# Patient Record
Sex: Male | Born: 1965 | Race: White | Hispanic: No | Marital: Single | Smoking: Current every day smoker
Health system: Southern US, Community
[De-identification: ages and names within clinical notes are randomized; demographics above are authoritative.]

## PROBLEM LIST (undated history)

## (undated) DIAGNOSIS — E7800 Pure hypercholesterolemia, unspecified: Secondary | ICD-10-CM

## (undated) DIAGNOSIS — T8859XA Other complications of anesthesia, initial encounter: Secondary | ICD-10-CM

## (undated) DIAGNOSIS — I10 Essential (primary) hypertension: Secondary | ICD-10-CM

## (undated) DIAGNOSIS — M1A9XX1 Chronic gout, unspecified, with tophus (tophi): Secondary | ICD-10-CM

## (undated) HISTORY — DX: Chronic gout, unspecified, with tophus (tophi): M1A.9XX1

## (undated) HISTORY — PX: NECK SURGERY: SHX720

---
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
## 2011-09-09 ENCOUNTER — Emergency Department: Payer: Self-pay | Admitting: *Deleted

## 2011-09-09 LAB — CBC
HCT: 44.3 % (ref 40.0–52.0)
HGB: 14.9 g/dL (ref 13.0–18.0)
MCH: 29.9 pg (ref 26.0–34.0)
MCHC: 33.7 g/dL (ref 32.0–36.0)
Platelet: 224 10*3/uL (ref 150–440)
RBC: 4.99 10*6/uL (ref 4.40–5.90)
RDW: 13.7 % (ref 11.5–14.5)
WBC: 10 10*3/uL (ref 3.8–10.6)

## 2011-09-09 LAB — COMPREHENSIVE METABOLIC PANEL
Albumin: 3.5 g/dL (ref 3.4–5.0)
Alkaline Phosphatase: 99 U/L (ref 50–136)
BUN: 13 mg/dL (ref 7–18)
Bilirubin,Total: 0.3 mg/dL (ref 0.2–1.0)
Calcium, Total: 8.6 mg/dL (ref 8.5–10.1)
Co2: 27 mmol/L (ref 21–32)
Creatinine: 0.97 mg/dL (ref 0.60–1.30)
EGFR (Non-African Amer.): >60
Osmolality: 284 (ref 275–301)
Potassium: 3.8 mmol/L (ref 3.5–5.1)
Total Protein: 7.1 g/dL (ref 6.4–8.2)

## 2011-09-09 LAB — LIPASE, BLOOD: Lipase: 121 U/L (ref 73–393)

## 2014-10-11 ENCOUNTER — Encounter: Payer: Self-pay | Admitting: Emergency Medicine

## 2014-10-11 ENCOUNTER — Emergency Department: Payer: Self-pay

## 2014-10-11 ENCOUNTER — Emergency Department
Admission: EM | Admit: 2014-10-11 | Discharge: 2014-10-11 | Disposition: A | Discharge status: Home / Self Care | Payer: Self-pay | Attending: Emergency Medicine | Admitting: Emergency Medicine

## 2014-10-11 DIAGNOSIS — Z720 Tobacco use: Secondary | ICD-10-CM | POA: Insufficient documentation

## 2014-10-11 DIAGNOSIS — Z79899 Other long term (current) drug therapy: Secondary | ICD-10-CM | POA: Insufficient documentation

## 2014-10-11 DIAGNOSIS — I10 Essential (primary) hypertension: Secondary | ICD-10-CM | POA: Insufficient documentation

## 2014-10-11 DIAGNOSIS — L03115 Cellulitis of right lower limb: Secondary | ICD-10-CM | POA: Insufficient documentation

## 2014-10-11 DIAGNOSIS — K625 Hemorrhage of anus and rectum: Secondary | ICD-10-CM | POA: Insufficient documentation

## 2014-10-11 HISTORY — DX: Essential (primary) hypertension: I10

## 2014-10-11 LAB — COMPREHENSIVE METABOLIC PANEL
ALBUMIN: 3.8 g/dL (ref 3.5–5.0)
ALK PHOS: 95 U/L (ref 38–126)
ALT: 57 U/L (ref 17–63)
ANION GAP: 11 (ref 5–15)
AST: 53 U/L — AB (ref 15–41)
BUN: 21 mg/dL — AB (ref 6–20)
CO2: 25 mmol/L (ref 22–32)
Calcium: 9.1 mg/dL (ref 8.9–10.3)
Chloride: 104 mmol/L (ref 101–111)
Creatinine, Ser: 1.29 mg/dL — ABNORMAL HIGH (ref 0.61–1.24)
GFR calc Af Amer: >60 mL/min (ref >60)
GFR calc non Af Amer: >60 mL/min (ref >60)
GLUCOSE: 200 mg/dL — AB (ref 65–99)
POTASSIUM: 3.8 mmol/L (ref 3.5–5.1)
SODIUM: 140 mmol/L (ref 135–145)
Total Bilirubin: 0.3 mg/dL (ref 0.3–1.2)
Total Protein: 7.3 g/dL (ref 6.5–8.1)

## 2014-10-11 LAB — ABO/RH: ABO/RH(D): A NEG

## 2014-10-11 LAB — CBC
HEMATOCRIT: 44.3 % (ref 40.0–52.0)
HEMOGLOBIN: 15 g/dL (ref 13.0–18.0)
MCH: 29.5 pg (ref 26.0–34.0)
MCHC: 34 g/dL (ref 32.0–36.0)
MCV: 86.8 fL (ref 80.0–100.0)
Platelets: 243 10*3/uL (ref 150–440)
RBC: 5.1 MIL/uL (ref 4.40–5.90)
RDW: 13.9 % (ref 11.5–14.5)
WBC: 11.1 10*3/uL — ABNORMAL HIGH (ref 3.8–10.6)

## 2014-10-11 LAB — TYPE AND SCREEN
ABO/RH(D): A NEG
Antibody Screen: NEGATIVE

## 2014-10-11 MED ORDER — CEFAZOLIN SODIUM 1-5 GM-% IV SOLN
1.0000 g | Freq: Once | INTRAVENOUS | Status: AC
  Administered 2014-10-11: 1 g via INTRAVENOUS
  Filled 2014-10-11: qty 50

## 2014-10-11 MED ORDER — CEPHALEXIN 500 MG PO CAPS: 500.0000 mg | ORAL_CAPSULE | Freq: Two times a day (BID) | ORAL | Status: DC

## 2014-10-11 NOTE — ED Provider Notes (Signed)
[facility] Emergency Department Provider Note  ____________________________________________  Time seen: 1:30 PM  I have reviewed the triage vital signs and the nursing notes.   HISTORY  Chief Complaint Wound Infection and Rectal Bleeding    HPI Howard Sanders is a 49 y.o. male who presents with complaints of right leg infection. He reports that he had a pimple in his leg that he popped about 5 days ago and he developed redness in the lower extremity. He went to urgent care and was started on antibiotics but has been taking them incorrectly. He is taking Septra once a day as opposed to twice a day. He reports he has burning in his right leg but the pain is mild to moderate. He does not complain of abnormal stools today. He denies fevers chills. 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     Past Medical History  Diagnosis Date  . Hypertension     There are no active problems to display for this patient.   No past surgical history on file.  Current Outpatient Rx  Name  Route  Sig  Dispense  Refill  . lisinopril-hydrochlorothiazide (PRINZIDE,ZESTORETIC) 10-12.5 MG per tablet   Oral   Take 1 tablet by mouth daily.         . cephALEXin (KEFLEX) 500 MG capsule   Oral   Take 1 capsule (500 mg total) by mouth 2 (two) times daily.   14 capsule   0     Allergies Bee venom and Iodine  No family history on file.  Social History Social History  Substance Use Topics  . Smoking status: Current Every Day Smoker  . Smokeless tobacco: None  . Alcohol Use: No    Review of Systems  Constitutional: Negative for fever. Eyes: Negative for visual changes. ENT: Negative for sore throat Cardiovascular: Negative for chest pain. Respiratory: Negative for shortness of breath. Gastrointestinal: Negative for abdominal pain, vomiting and diarrhea. Genitourinary: Negative for dysuria. Musculoskeletal: Negative for back  pain. Skin: Right leg rash and swelling Neurological: Negative for headaches or focal weakness Psychiatric: No anxiety    ____________________________________________   PHYSICAL EXAM:  VITAL SIGNS: ED Triage Vitals  Enc Vitals Group     BP 10/11/14 1009 138/94 mmHg     Pulse Rate 10/11/14 1009 96     Resp 10/11/14 1009 18     Temp 10/11/14 1009 97.9 F (36.6 C)     Temp src --      SpO2 10/11/14 1009 96 %     Weight 10/11/14 1009 245 lb (111.131 kg)     Height 10/11/14 1009 6' (1.829 m)     Head Cir --      Peak Flow --      Pain Score 10/11/14 1009 4     Pain Loc --      Pain Edu? --      Excl. in GC? --      Constitutional: Alert and oriented. Well appearing and in no distress. Eyes: Conjunctivae are normal.  ENT   Head: Normocephalic and atraumatic.   Mouth/Throat: Mucous membranes are moist. Cardiovascular: Normal rate, regular rhythm. Normal and symmetric distal pulses are present in all extremities. No murmurs, rubs, or gallops. Respiratory: Normal respiratory effort without tachypnea nor retractions. Breath sounds are clear and equal bilaterally.  Gastrointestinal: Soft and non-tender in all quadrants. No distention. There is no CVA tenderness. Genitourinary: deferred Musculoskeletal: Nontender with  normal range of motion in all extremities. No lower extremity tenderness nor edema. Neurologic:  Normal speech and language. No gross focal neurologic deficits are appreciated. Skin:  Skin is warm, dry and intact. Patient with swelling to the right lower show any primarily around the calf and anterior tibial area, mild swelling to the area. Some streaking noted as well Psychiatric: Mood and affect are normal. Patient exhibits appropriate insight and judgment.  ____________________________________________    LABS (pertinent positives/negatives)  Labs Reviewed  COMPREHENSIVE METABOLIC PANEL - Abnormal; Notable for the following:    Glucose, Bld 200 (*)     BUN 21 (*)    Creatinine, Ser 1.29 (*)    AST 53 (*)    All other components within normal limits  CBC - Abnormal; Notable for the following:    WBC 11.1 (*)    All other components within normal limits  TYPE AND SCREEN  ABO/RH    ____________________________________________   EKG  None  ____________________________________________    RADIOLOGY I have personally reviewed any xrays that were ordered on this patient: Ultrasound shows no DVT  ____________________________________________   PROCEDURES  Procedure(s) performed: none  Critical Care performed: none  ____________________________________________   INITIAL IMPRESSION / ASSESSMENT AND PLAN / ED COURSE  Pertinent labs & imaging results that were available during my care of the patient were reviewed by me and considered in my medical decision making (see chart for details).  Patient overall well-appearing, afebrile, normal heart rate. His white count is minimally elevated. I will give him 1 g of Ancef in the emergency department and check an ultrasound of his leg to rule out DVT given his occupation.   Ultrasound was negative for DVT.  I did educate him on the proper dosing of Bactrim and I will also add on Keflex. I discussed return precautions with the patient, he knows to return if any worsening of discomfort swelling or development of fevers chills nausea or vomiting  ____________________________________________   FINAL CLINICAL IMPRESSION(S) / ED DIAGNOSES  Final diagnoses:  Cellulitis of right lower extremity     Jene Every, MD 10/11/14 1526

## 2014-10-11 NOTE — ED Notes (Signed)
Pt to ultrasound

## 2014-10-11 NOTE — Discharge Instructions (Signed)

## 2014-10-11 NOTE — ED Notes (Addendum)
Pt reports that he had "pimple" in his groin area that popped during his sleep on Wednesday night. By Thursday morning, he began to see redness and have pain running down the right leg. His thigh is red, and his lower right leg is very red and hot. Pt went to urgent care with fever and redness on Friday and was given bactrim. He took it but thinks it has caused him to have bloody stool. He reports that his BM this morning was very thick and black. Pt is alert & oriented with warm, dry skin.

## 2014-10-11 NOTE — ED Notes (Signed)
Says being treated with septra for cellulitis right lower leg. Leg no better, and he is now having blood in stools (black stools.)

## 2014-10-11 NOTE — ED Notes (Signed)
Pt discharged home after verbalizing understanding of discharge instructions; nad noted. 

## 2014-10-29 ENCOUNTER — Encounter: Payer: Self-pay | Admitting: Family Medicine

## 2014-11-03 DIAGNOSIS — R609 Edema, unspecified: Secondary | ICD-10-CM

## 2014-11-03 DIAGNOSIS — R739 Hyperglycemia, unspecified: Secondary | ICD-10-CM

## 2014-11-03 DIAGNOSIS — I10 Essential (primary) hypertension: Secondary | ICD-10-CM | POA: Insufficient documentation

## 2014-11-03 DIAGNOSIS — E781 Pure hyperglyceridemia: Secondary | ICD-10-CM | POA: Insufficient documentation

## 2014-11-03 DIAGNOSIS — E119 Type 2 diabetes mellitus without complications: Secondary | ICD-10-CM | POA: Insufficient documentation

## 2014-11-08 ENCOUNTER — Encounter: Payer: Self-pay | Admitting: Family Medicine

## 2014-11-10 ENCOUNTER — Ambulatory Visit (INDEPENDENT_AMBULATORY_CARE_PROVIDER_SITE_OTHER): Payer: Self-pay | Admitting: Family Medicine

## 2014-11-10 ENCOUNTER — Encounter: Payer: Self-pay | Admitting: Family Medicine

## 2014-11-10 VITALS — BP 140/80 | HR 98 | Temp 97.7°F | Resp 16 | Ht 72.0 in | Wt 246.0 lb

## 2014-11-10 DIAGNOSIS — R739 Hyperglycemia, unspecified: Secondary | ICD-10-CM

## 2014-11-10 DIAGNOSIS — Z720 Tobacco use: Secondary | ICD-10-CM

## 2014-11-10 DIAGNOSIS — E781 Pure hyperglyceridemia: Secondary | ICD-10-CM

## 2014-11-10 DIAGNOSIS — R609 Edema, unspecified: Secondary | ICD-10-CM

## 2014-11-10 DIAGNOSIS — I10 Essential (primary) hypertension: Secondary | ICD-10-CM

## 2014-11-10 MED ORDER — FUROSEMIDE 20 MG PO TABS: 20.0000 mg | ORAL_TABLET | Freq: Every day | ORAL | Status: DC

## 2014-11-10 MED ORDER — LISINOPRIL-HYDROCHLOROTHIAZIDE 10-12.5 MG PO TABS: 1.0000 | ORAL_TABLET | Freq: Every day | ORAL | Status: DC

## 2014-11-10 NOTE — Patient Instructions (Signed)
Please stop smoking 

## 2014-11-10 NOTE — Progress Notes (Signed)
Patient: Howard Sanders Male    DOB: 1965-07-13   49 y.o.   MRN: [id number] Visit Date: 11/10/2014  Today's Provider: Mila Merry, MD   Chief Complaint  Patient presents with  . Follow-up  . Medication Refill  . Hypertension   Subjective:    HPI   Hypertriglycerideamia Last lipid 09/02/2012 found TC=242, TAG=928 and very low HDL=19.  He has not returned since then for follow up due to lack of health insurance. 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  He was seen in ER 10/11/14 for cellulitis of right leg which has now resolved.  His blood sugar is noted to have 200 at the time.  He had normal renal functions at that time.     Hypertension, follow-up:  BP Readings from Last 3 Encounters:  11/10/14 140/80  10/11/14 138/94    He was last seen for hypertension 09/26/2012. BP at that visit was 118/84. Management since that visit includes none .He reports good compliance with treatment. He is not having side effects. none  He is exercising. He is not adherent to low salt diet.   Outside blood pressures are 117/69. He is experiencing none.  Patient denies none.   Cardiovascular risk factors include none.  Use of agents associated with hypertension: none.   BMP Latest Ref Rng 10/11/2014 09/09/2011  Glucose 65 - 99 mg/dL 409(W) 119(J)  BUN 6 - 20 mg/dL 47(W) 13  Creatinine 2.95 - 1.24 mg/dL 6.21(H) 0.86  Sodium 578 - 145 mmol/L 140 142  Potassium 3.5 - 5.1 mmol/L 3.8 3.8  Chloride 101 - 111 mmol/L 104 107  CO2 22 - 32 mmol/L 25 27  Calcium 8.9 - 10.3 mg/dL 9.1 8.6     ---------------------------------------------------------------------       Allergies  Allergen Reactions  . Bee Venom Anaphylaxis  . Iodine Anaphylaxis   Previous Medications   CEPHALEXIN (KEFLEX) 500 MG CAPSULE    Take 1 capsule (500 mg total) by mouth 2 (two) times daily.   LISINOPRIL-HYDROCHLOROTHIAZIDE (PRINZIDE,ZESTORETIC)  10-12.5 MG PER TABLET    Take 1 tablet by mouth daily.   OMEGA-3 FATTY ACIDS (FISH OIL PO)    Take by mouth.    Review of Systems  Cardiovascular: Positive for leg swelling. Negative for chest pain and palpitations.  Neurological: Positive for light-headedness. Negative for headaches.    Social History  Substance Use Topics  . Smoking status: Current Every Day Smoker  . Smokeless tobacco: Not on file  . Alcohol Use: No   Objective:   BP 140/80 mmHg  Pulse 98  Temp(Src) 97.7 F (36.5 C) (Oral)  Resp 16  Ht 6' (1.829 m)  Wt 246 lb (111.585 kg)  BMI 33.36 kg/m2  SpO2 97%  Physical Exam  General Appearance:    Alert, cooperative, no distress, obese  Eyes:    PERRL, conjunctiva/corneas clear, EOM's intact       Lungs:     Clear to auscultation bilaterally, respirations unlabored  Heart:    Regular rate and rhythm, 1+ bilateral LE edema. No erythema or tenderness in either leg.   Neurologic:   Awake, alert, oriented x 3. No apparent focal neurological           defect.           Assessment & Plan:     1. Hyperglycemia 200 at recent ER visit (non-fasting) - Hemoglobin A1c  2. Essential hypertension well controlled Continue current medications.   - lisinopril-hydrochlorothiazide (PRINZIDE,ZESTORETIC) 10-12.5 MG per tablet; Take 1 tablet by mouth daily.  Dispense: 30 tablet; Refill: 3  3. Hypertriglyceridemia  - Lipid panel  4. Tobacco abuse Stop smotking  5. Edema  - He would like something to take prn as his legs swelling quite a bit when he is driving. He did have normal TSH when evaluated for this in 2014 .Given rx for furosemide .        Mila Merry, MD  [facility]

## 2014-11-11 ENCOUNTER — Telehealth: Payer: Self-pay | Admitting: Family Medicine

## 2014-11-11 LAB — LIPID PANEL
CHOLESTEROL TOTAL: 231 mg/dL — AB (ref 100–199)
Chol/HDL Ratio: 13.6 ratio units — ABNORMAL HIGH (ref 0.0–5.0)
HDL: 17 mg/dL — ABNORMAL LOW (ref >39)
TRIGLYCERIDES: 729 mg/dL — AB (ref 0–149)

## 2014-11-11 LAB — HEMOGLOBIN A1C
Est. average glucose Bld gHb Est-mCnc: 148 mg/dL
HEMOGLOBIN A1C: 6.8 % — AB (ref 4.8–5.6)

## 2014-11-11 MED ORDER — METFORMIN HCL ER 500 MG PO TB24: 500.0000 mg | ORAL_TABLET | Freq: Every day | ORAL | Status: DC

## 2014-11-11 MED ORDER — LOVASTATIN 40 MG PO TABS: 40.0000 mg | ORAL_TABLET | Freq: Every day | ORAL | Status: DC

## 2014-11-11 NOTE — Telephone Encounter (Signed)
Pt called to get lab results. Thanks TNP ° °

## 2014-11-11 NOTE — Telephone Encounter (Signed)
Patient was notified of results. Expressed understanding. Rx's sent to pharmacy.  

## 2014-11-11 NOTE — Telephone Encounter (Signed)
-----   Message from Malva Limes, MD sent at 11/11/2014  7:56 AM EDT ----- Labs show his blood sugar is averaging 148 over the last 3 months, which is in diabetic range. Cholesterol is high at 231 and triglycerides are very high at 729. He has very high risk of developing heart disease. Need to start lovastatin  one tablet every evening for cholesterol, #30, rf x 2 Need to start metformin ER , one tablet every evening for blood sugar, #30, rf x 2 Need to take 81 mg coated aspirin once a day. Avoid sweets and starchy foods like bread, white pasta, and white potatoes.  Follow up on meds and BP check in 6 weeks.

## 2014-12-20 ENCOUNTER — Encounter: Payer: Self-pay | Admitting: Family Medicine

## 2014-12-20 ENCOUNTER — Ambulatory Visit (INDEPENDENT_AMBULATORY_CARE_PROVIDER_SITE_OTHER): Payer: Self-pay | Admitting: Family Medicine

## 2014-12-20 VITALS — BP 104/84 | HR 81 | Temp 98.0°F | Resp 16 | Ht 72.0 in | Wt 236.0 lb

## 2014-12-20 DIAGNOSIS — E781 Pure hyperglyceridemia: Secondary | ICD-10-CM

## 2014-12-20 DIAGNOSIS — R601 Generalized edema: Secondary | ICD-10-CM

## 2014-12-20 DIAGNOSIS — E119 Type 2 diabetes mellitus without complications: Secondary | ICD-10-CM

## 2014-12-20 DIAGNOSIS — I10 Essential (primary) hypertension: Secondary | ICD-10-CM

## 2014-12-20 DIAGNOSIS — Z0000 Encounter for general adult medical examination without abnormal findings: Secondary | ICD-10-CM

## 2014-12-20 MED ORDER — FUROSEMIDE 20 MG PO TABS: 20.0000 mg | ORAL_TABLET | Freq: Every day | ORAL | Status: DC

## 2014-12-20 NOTE — Patient Instructions (Signed)
   Recommend taking 81mg enteric coated aspirin to reduce risk of vascular events such as heart attacks and strokes.    

## 2014-12-20 NOTE — Progress Notes (Signed)
Patient: Howard Sanders Male    DOB: 06/23/1965   49 y.o.   MRN: [id number] Visit Date: 12/20/2014  Today's Provider: Mila Merryonald Fisher, MD   Chief Complaint  Patient presents with  . Follow-up  . Hypertension   Subjective:    HPI   Lipid/Cholesterol, Follow-up:    Management changes since that visit include starting lovastatin. . Last Lipid Panel:    Component Value Date/Time   CHOL 231* 11/10/2014 0859   TRIG 729* 11/10/2014 0859   HDL 17* 11/10/2014 0859   CHOLHDL 13.6* 11/10/2014 0859   LDLCALC Comment 11/10/2014 0859     He reports good compliance with treatment. He is not having side effects.    Wt Readings from Last 3 Encounters:  12/20/14 236 lb (107.049 kg)  11/10/14 246 lb (111.585 kg)  10/11/14 245 lb (111.131 kg)    -------------------------------------------------------------------   Diabetes Mellitus Type II, Follow-up:   Lab Results  Component Value Date   HGBA1C 6.8* 11/10/2014    Last seen for diabetes 1 months ago.  Management since then includes starting metformin ER. He reports good compliance with treatment. He is having side effects. Frequent bowel movements   ------------------------------------------------------------------------      Hypertension, follow-up:  BP Readings from Last 3 Encounters:  12/20/14 104/84  11/10/14 140/80  10/11/14 138/94    He was last seen for hypertension 1 months ago.  BP at that visit was 140/80. Management since that visit includes continuing lisinopril-hctz 10-12.5  He reports good compliance with treatment. He is not having side effects. none  He yes exercising. He is not adherent to low salt diet.   Outside blood pressures are 116/75. He is experiencing none.  Patient denies none.   Cardiovascular risk factors include none.  Use of agents associated with hypertension: none.     Weight trend: decreasing steadily Wt Readings from Last 3 Encounters:  12/20/14 236 lb  (107.049 kg)  11/10/14 246 lb (111.585 kg)  10/11/14 245 lb (111.131 kg)    Current diet: well balanced  ----------------------------------------------------------------------  Edema: Is doing much better since starting furosemide which he is tolerating well.    Allergies  Allergen Reactions  . Bee Venom Anaphylaxis  . Iodine Anaphylaxis   Previous Medications   FUROSEMIDE (LASIX) 20 MG TABLET    Take 1 tablet (20 mg total) by mouth daily. As needed for swelling   LISINOPRIL-HYDROCHLOROTHIAZIDE (PRINZIDE,ZESTORETIC) 10-12.5 MG PER TABLET    Take 1 tablet by mouth daily.   LOVASTATIN (MEVACOR) 40 MG TABLET    Take 1 tablet (40 mg total) by mouth at bedtime.   METFORMIN (GLUCOPHAGE-XR) 500 MG 24 HR TABLET    Take 1 tablet (500 mg total) by mouth daily with breakfast.    Review of Systems  Cardiovascular: Negative for chest pain and palpitations.  Gastrointestinal: Positive for abdominal pain.       Upper abdomin     Social History  Substance Use Topics  . Smoking status: Current Every Day Smoker  . Smokeless tobacco: Not on file  . Alcohol Use: No   Objective:   BP 104/84 mmHg  Pulse 81  Temp(Src) 98 F (36.7 C) (Oral)  Resp 16  Ht 6' (1.829 m)  Wt 236 lb (107.049 kg)  BMI 32.00 kg/m2  SpO2 93%    Depression screen PHQ 2/9 12/20/2014  Decreased Interest 0  Down, Depressed, Hopeless 0  PHQ - 2 Score 0  Altered sleeping 0  Tired, decreased energy 0  Change in appetite 0  Feeling bad or failure about yourself  0  Trouble concentrating 0  Moving slowly or fidgety/restless 0  Suicidal thoughts 0  PHQ-9 Score 0  Difficult doing work/chores Not difficult at all      Physical Exam  General Appearance:    Alert, cooperative, no distress, obese  Eyes:    PERRL, conjunctiva/corneas clear, EOM's intact       Lungs:     Clear to auscultation bilaterally, respirations unlabored  Heart:    Regular rate and rhythm  Neurologic:   Awake, alert, oriented x 3. No  apparent focal neurological           defect.          Assessment & Plan:     1. Generalized edema Doing well with the addition of furosemide  2. Hypertriglyceridemia He is tolerating lovastatin well with no adverse effects.   - Lipid panel  3. Essential hypertension Well controlled. Continue current medications.    4. Type 2 diabetes mellitus without complication, without long-term current use of insulin (HCC) Continue metformin ER. Recheck a1c in 2 Months.        Mila Merry, MD  [facility]

## 2014-12-21 LAB — LIPID PANEL
CHOL/HDL RATIO: 8.1 ratio — AB (ref 0.0–5.0)
CHOLESTEROL TOTAL: 161 mg/dL (ref 100–199)
HDL: 20 mg/dL — ABNORMAL LOW (ref >39)
Triglycerides: 470 mg/dL — ABNORMAL HIGH (ref 0–149)

## 2015-02-07 ENCOUNTER — Other Ambulatory Visit: Payer: Self-pay | Admitting: Family Medicine

## 2015-02-07 DIAGNOSIS — I10 Essential (primary) hypertension: Secondary | ICD-10-CM

## 2015-02-07 NOTE — Telephone Encounter (Signed)
Pt contacted office for refill request on the following medications: 1. furosemide (LASIX) 20 MG tablet  2. lisinopril-hydrochlorothiazide (PRINZIDE,ZESTORETIC) 10-12.5 MG per tablet 3. lovastatin (MEVACOR) 40 MG tablet 4. metFORMIN (GLUCOPHAGE-XR) 500 MG 24 hr tablet Pt [facility] [address] [facility] day supply. 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 Pt stated that 90 day supply would be easier for him to manage. Thanks TNP

## 2015-02-07 NOTE — Telephone Encounter (Signed)
Requesting 90 day supply.

## 2015-02-08 MED ORDER — LOVASTATIN 40 MG PO TABS: 40.0000 mg | ORAL_TABLET | Freq: Every day | ORAL | Status: DC

## 2015-02-08 MED ORDER — METFORMIN HCL ER 500 MG PO TB24: 500.0000 mg | ORAL_TABLET | Freq: Every day | ORAL | Status: DC

## 2015-02-08 MED ORDER — LISINOPRIL-HYDROCHLOROTHIAZIDE 10-12.5 MG PO TABS: 1.0000 | ORAL_TABLET | Freq: Every day | ORAL | Status: DC

## 2015-02-08 MED ORDER — FUROSEMIDE 20 MG PO TABS: 20.0000 mg | ORAL_TABLET | Freq: Every day | ORAL | Status: DC

## 2015-02-22 ENCOUNTER — Ambulatory Visit (INDEPENDENT_AMBULATORY_CARE_PROVIDER_SITE_OTHER): Payer: Self-pay | Admitting: Family Medicine

## 2015-02-22 ENCOUNTER — Encounter: Payer: Self-pay | Admitting: Family Medicine

## 2015-02-22 VITALS — BP 130/84 | HR 87 | Temp 98.7°F | Resp 16 | Ht 72.0 in | Wt 237.0 lb

## 2015-02-22 DIAGNOSIS — E119 Type 2 diabetes mellitus without complications: Secondary | ICD-10-CM

## 2015-02-22 LAB — POCT GLYCOSYLATED HEMOGLOBIN (HGB A1C)
Est. average glucose Bld gHb Est-mCnc: 128
Hemoglobin A1C: 6.1

## 2015-02-22 NOTE — Patient Instructions (Signed)
Check your FASTING blood sugars at least 3 times each week Call or return for early office visit if you see your FASTING blood sugars consistently above 150.

## 2015-02-22 NOTE — Progress Notes (Signed)
Patient: Howard Sanders Male    DOB: Jul 03, 1965   50 y.o.   MRN: [id number] Visit Date: 02/22/2015  Today's Provider: Mila Merry, MD   Chief Complaint  Patient presents with  . Follow-up  . Diabetes  . Hypertension  . Hyperlipidemia  . Edema   Subjective:    HPI  Follow-up for edema from 12/20/2014;   Diabetes Mellitus Type II, Follow-up:   Lab Results  Component Value Date   HGBA1C 6.8* 11/10/2014   Last seen for diabetes 2 months ago.  Management since then includes; no changes He reports good compliance with treatment. He is not having side effects. none Current symptoms include none and have been stable. Home blood sugar records: fasting range: n/a  Episodes of hypoglycemia? no   Current Insulin Regimen: n/a Most Recent Eye Exam: n/a Weight trend: stable Prior visit with dietician: no Current diet: well balanced  Has cut out soft drinks completely from diet.  Current exercise: walking  ----------------------------------------------------------------------   Hypertension, follow-up:  BP Readings from Last 3 Encounters:  02/22/15 130/84  12/20/14 104/84  11/10/14 140/80    He was last seen for hypertension 2 months ago.  BP at that visit was 104/84 Management since that visit includes;none. Hegood compliance with treatment. He is not having side effects. none  He is exercising. He is not adherent to low salt diet.   Outside blood pressures are 130/78. He is experiencing none.  Patient denies none.   Cardiovascular risk factors include none.  Use of agents associated with hypertension: none.   ----------------------------------------------------------------------    Lipid/Cholesterol, Follow-up:   Last seen for this 2 months ago.  Management since that visit includes; continuing lovastatin   Last Lipid Panel:  Lab Results  Component Value Date   CHOL 161 12/20/2014   CHOL 231* 11/10/2014   Lab Results  Component Value Date     HDL 20* 12/20/2014   HDL 17* 11/10/2014   Lab Results  Component Value Date   LDLCALC Comment 12/20/2014   LDLCALC Comment 11/10/2014   Lab Results  Component Value Date   TRIG 470* 12/20/2014   TRIG 729* 11/10/2014   Lab Results  Component Value Date   CHOLHDL 8.1* 12/20/2014   CHOLHDL 13.6* 11/10/2014   No results found for: LDLDIRECT   He reports good compliance with treatment. He is not having side effects. none  Wt Readings from Last 3 Encounters:  02/22/15 237 lb (107.502 kg)  12/20/14 236 lb (107.049 kg)  11/10/14 246 lb (111.585 kg)    ----------------------------------------------------------------------     Allergies  Allergen Reactions  . Bee Venom Anaphylaxis  . Iodine Anaphylaxis   Previous Medications   FUROSEMIDE (LASIX) 20 MG TABLET    Take 1 tablet (20 mg total) by mouth daily. As needed for swelling   LISINOPRIL-HYDROCHLOROTHIAZIDE (PRINZIDE,ZESTORETIC) 10-12.5 MG TABLET    Take 1 tablet by mouth daily.   LOVASTATIN (MEVACOR) 40 MG TABLET    Take 1 tablet (40 mg total) by mouth at bedtime.   METFORMIN (GLUCOPHAGE-XR) 500 MG 24 HR TABLET    Take 1 tablet (500 mg total) by mouth daily with breakfast.    Review of Systems  Constitutional: Negative for fever, chills and appetite change.  Respiratory: Negative for chest tightness, shortness of breath and wheezing.   Cardiovascular: Negative for chest pain and palpitations.  Gastrointestinal: Negative for nausea, vomiting and abdominal pain.    Social History  Substance Use Topics  .  Smoking status: Current Every Day Smoker  . Smokeless tobacco: Not on file  . Alcohol Use: No   Objective:   BP 130/84 mmHg  Pulse 87  Temp(Src) 98.7 F (37.1 C) (Oral)  Resp 16  Ht 6' (1.829 m)  Wt 237 lb (107.502 kg)  BMI 32.14 kg/m2  SpO2 97%  Physical Exam  General Appearance:    Alert, cooperative, no distress, obese  Eyes:    PERRL, conjunctiva/corneas clear, EOM's intact       Lungs:      Clear to auscultation bilaterally, respirations unlabored  Heart:    Regular rate and rhythm  Neurologic:   Awake, alert, oriented x 3. No apparent focal neurological           defect.       Results for orders placed or performed in visit on 02/22/15  POCT glycosylated hemoglobin (Hb A1C)  Result Value Ref Range   Hemoglobin A1C 6.1    Est. average glucose Bld gHb Est-mCnc 128        Assessment & Plan:     1. Type 2 diabetes mellitus without complication, without long-term current use of insulin (HCC) Doing much better with dietary changes and metformin Encouraged to get diabetic eye exam.  - POCT glycosylated hemoglobin (Hb A1C)    Follow up 6 months.       Mila Merryonald Delma Drone, MD  [facility]

## 2015-05-14 ENCOUNTER — Other Ambulatory Visit: Payer: Self-pay | Admitting: Family Medicine

## 2015-05-16 ENCOUNTER — Other Ambulatory Visit: Payer: Self-pay | Admitting: Family Medicine

## 2015-05-16 MED ORDER — LOVASTATIN 40 MG PO TABS: ORAL_TABLET | ORAL | Status: DC

## 2015-05-16 MED ORDER — METFORMIN HCL ER 500 MG PO TB24: ORAL_TABLET | ORAL | Status: DC

## 2015-05-16 NOTE — Telephone Encounter (Signed)
Pt needs refills on   metFORMIN (GLUCOPHAGE-XR) 500 MG 24 hr tablet lovastatin (MEVACOR) 40 MG tablet  [facility]  Thanks Barth Kirkseri

## 2015-08-22 ENCOUNTER — Ambulatory Visit: Payer: Self-pay | Admitting: Family Medicine

## 2015-08-24 ENCOUNTER — Other Ambulatory Visit: Payer: Self-pay | Admitting: Family Medicine

## 2015-08-24 DIAGNOSIS — I10 Essential (primary) hypertension: Secondary | ICD-10-CM

## 2015-08-24 MED ORDER — LISINOPRIL-HYDROCHLOROTHIAZIDE 10-12.5 MG PO TABS: 1.0000 | ORAL_TABLET | Freq: Every day | ORAL | Status: DC

## 2015-08-24 MED ORDER — FUROSEMIDE 20 MG PO TABS: 20.0000 mg | ORAL_TABLET | Freq: Every day | ORAL | Status: DC

## 2015-08-24 NOTE — Telephone Encounter (Signed)
Pt contacted office for refill request on the following medications:  [facility] [address] [place]  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

## 2015-10-18 ENCOUNTER — Ambulatory Visit: Payer: Self-pay | Admitting: Family Medicine

## 2015-10-21 ENCOUNTER — Ambulatory Visit: Payer: Self-pay | Admitting: Family Medicine

## 2015-10-25 ENCOUNTER — Ambulatory Visit (INDEPENDENT_AMBULATORY_CARE_PROVIDER_SITE_OTHER): Payer: Self-pay | Admitting: Family Medicine

## 2015-10-25 ENCOUNTER — Encounter: Payer: Self-pay | Admitting: Family Medicine

## 2015-10-25 VITALS — BP 102/64 | HR 89 | Temp 98.0°F | Resp 16 | Ht 72.0 in | Wt 234.0 lb

## 2015-10-25 DIAGNOSIS — I10 Essential (primary) hypertension: Secondary | ICD-10-CM

## 2015-10-25 DIAGNOSIS — E119 Type 2 diabetes mellitus without complications: Secondary | ICD-10-CM

## 2015-10-25 DIAGNOSIS — Z720 Tobacco use: Secondary | ICD-10-CM

## 2015-10-25 LAB — POCT GLYCOSYLATED HEMOGLOBIN (HGB A1C)
ESTIMATED AVERAGE GLUCOSE: 143
Hemoglobin A1C: 6.6

## 2015-10-25 MED ORDER — HYDROCHLOROTHIAZIDE 12.5 MG PO TABS: 12.5000 mg | ORAL_TABLET | Freq: Every day | ORAL | 3 refills | Status: DC

## 2015-10-25 MED ORDER — BUPROPION HCL ER (SR) 150 MG PO TB12: ORAL_TABLET | ORAL | 6 refills | Status: DC

## 2015-10-25 NOTE — Progress Notes (Signed)
Patient: Howard Sanders Male    DOB: 02/15/1966   50 y.o.   MRN: [id number] Visit Date: 10/25/2015  Today's Provider: Mila Merryonald Rynlee [place], MD   Chief Complaint  Patient presents with  . Follow-up  . Diabetes  . Hypertension  . Hyperlipidemia   Subjective:    HPI   Diabetes Mellitus Type II, Follow-up:   Lab Results  Component Value Date   HGBA1C 6.6 10/25/2015   HGBA1C 6.1 02/22/2015   HGBA1C 6.8 (H) 11/10/2014   Last seen for diabetes 8 months ago.  Management since then includes; no changes. encouraged to get diabetic eye exam. He reports good compliance with treatment. He is not having side effects. none Current symptoms include none and have been unchanged. Home blood sugar records: fasting range: not checking  Episodes of hypoglycemia? no   Current Insulin Regimen: n/a Most Recent Eye Exam: none Weight trend: stable Prior visit with dietician: no Current diet: well balanced Current exercise: walking  ----------------------------------------------------------------   Hypertension, follow-up:  BP Readings from Last 3 Encounters:  10/25/15 102/64  02/22/15 130/84  12/20/14 104/84    He was last seen for hypertension 11 months ago.  BP at that visit was 104/84. Management since that visit includes; no changes.He reports good compliance with treatment. He is not having side effects. none He is exercising. He is adherent to low salt diet.   Outside blood pressures are 108/70. He is experiencing none.  Patient denies none.   Cardiovascular risk factors include diabetes mellitus.  Use of agents associated with hypertension: none.   ----------------------------------------------------------------    Lipid/Cholesterol, Follow-up:   Last seen for this 11 months ago.  Management since that visit includes; no changes.  Last Lipid Panel:    Component Value Date/Time   CHOL 161 12/20/2014 0920   TRIG 470 (H) 12/20/2014 0920   HDL 20 (L) 12/20/2014  0920   CHOLHDL 8.1 (H) 12/20/2014 0920   LDLCALC Comment 12/20/2014 0920    He reports good compliance with treatment. He is not having side effects. none  Wt Readings from Last 3 Encounters:  10/25/15 234 lb (106.1 kg)  02/22/15 237 lb (107.5 kg)  12/20/14 236 lb (107 kg)    ----------------------------------------------------------------    Allergies  Allergen Reactions  . Bee Venom Anaphylaxis  . Iodine Anaphylaxis     Current Outpatient Prescriptions:  .  furosemide (LASIX) 20 MG tablet, Take 1 tablet (20 mg total) by mouth daily. As needed for swelling, Disp: 90 tablet, Rfl: 0 .  lisinopril-hydrochlorothiazide (PRINZIDE,ZESTORETIC) 10-12.5 MG tablet, Take 1 tablet by mouth daily., Disp: 90 tablet, Rfl: 0 .  lovastatin (MEVACOR) 40 MG tablet, TAKE 1 TABLET (40 MG TOTAL) BY MOUTH AT BEDTIME., Disp: 90 tablet, Rfl: 4 .  metFORMIN (GLUCOPHAGE-XR) 500 MG 24 hr tablet, TAKE 1 TABLET (500 MG TOTAL) BY MOUTH DAILY WITH BREAKFAST., Disp: 90 tablet, Rfl: 4  Review of Systems  Constitutional: Negative for appetite change, chills and fever.  Respiratory: Negative for chest tightness, shortness of breath and wheezing.   Cardiovascular: Negative for chest pain and palpitations.  Gastrointestinal: Negative for abdominal pain, nausea and vomiting.  Neurological: Positive for dizziness and light-headedness.    Social History  Substance Use Topics  . Smoking status: Current Every Day Smoker  . Smokeless tobacco: Not on file  . Alcohol use No   Objective:   BP 102/64 (BP Location: Right Arm, Patient Position: Sitting, Cuff Size: Large)   Pulse 89  Temp 98 F (36.7 C) (Oral)   Resp 16   Ht 6' (1.829 m)   Wt 234 lb (106.1 kg)   SpO2 97%   BMI 31.74 kg/m   Physical Exam  General Appearance:    Alert, cooperative, no distress, obese  Eyes:    PERRL, conjunctiva/corneas clear, EOM's intact       Lungs:     Clear to auscultation bilaterally, respirations unlabored  Heart:     Regular rate and rhythm  Neurologic:   Awake, alert, oriented x 3. No apparent focal neurological           defect.       Results for orders placed or performed in visit on 10/25/15  POCT glycosylated hemoglobin (Hb A1C)  Result Value Ref Range   Hemoglobin A1C 6.6    Est. average glucose Bld gHb Est-mCnc 143        Assessment & Plan:     1. Type 2 diabetes mellitus without complication, without long-term current use of insulin (HCC) Well controlled.  Has been getting strict with diet and losing weight. Encouraged regular exercise. Continue current medications.   - POCT glycosylated hemoglobin (Hb A1C)  2. Essential hypertension Change to hctz monotherapy due to episodes of hypotension. He is having mild edema so will continue diuretic instead of ACEI.   3. Tobacco abuse Discusses health risks of smoking and benefits of smoking cessation. Given rx bupropion to try when he is ready to quit.   Return in about 4 months (around 02/24/2016) for Yearly Physical.        Mila Merry, MD  [facility]

## 2015-10-25 NOTE — Patient Instructions (Addendum)
Please contact your eyecare professional to schedule a routine eye exam    Smoking Hazards Smoking cigarettes is extremely bad for your health. Tobacco smoke has over 200 known poisons in it. It contains the poisonous gases nitrogen oxide and carbon monoxide. There are over 60 chemicals in tobacco smoke that cause cancer. Some of the chemicals found in cigarette smoke include:   Cyanide.   Benzene.   Formaldehyde.   Methanol (wood alcohol).   Acetylene (fuel used in welding torches).   Ammonia.  Even smoking lightly shortens your life expectancy by several years. You can greatly reduce the risk of medical problems for you and your family by stopping now. Smoking is the most preventable cause of death and disease in our society. Within days of quitting smoking, your circulation improves, you decrease the risk of having a heart attack, and your lung capacity improves. There may be some increased phlegm in the first few days after quitting, and it may take months for your lungs to clear up completely. Quitting for 10 years reduces your risk of developing lung cancer to almost that of a nonsmoker.  WHAT ARE THE RISKS OF SMOKING? Cigarette smokers have an increased risk of many serious medical problems, including:  Lung cancer.   Lung disease (such as pneumonia, bronchitis, and emphysema).   Heart attack and chest pain due to the heart not getting enough oxygen (angina).   Heart disease and peripheral blood vessel disease.   Hypertension.   Stroke.   Oral cancer (cancer of the lip, mouth, or voice box).   Bladder cancer.   Pancreatic cancer.   Cervical cancer.   Pregnancy complications, including premature birth.   Stillbirths and smaller newborn babies, birth defects, and genetic damage to sperm.   Early menopause.   Lower estrogen level for women.   Infertility.   Facial wrinkles.   Blindness.   Increased risk of broken bones (fractures).    Senile dementia.   Stomach ulcers and internal bleeding.   Delayed wound healing and increased risk of complications during surgery. Because of secondhand smoke exposure, children of smokers have an increased risk of the following:   Sudden infant death syndrome (SIDS).   Respiratory infections.   Lung cancer.   Heart disease.   Ear infections.  WHY IS SMOKING ADDICTIVE? Nicotine is the chemical agent in tobacco that is capable of causing addiction or dependence. When you smoke and inhale, nicotine is absorbed rapidly into the bloodstream through your lungs. Both inhaled and noninhaled nicotine may be addictive.  WHAT ARE THE BENEFITS OF QUITTING?  There are many health benefits to quitting smoking. Some are:   The likelihood of developing cancer and heart disease decreases. Health improvements are seen almost immediately.   Blood pressure, pulse rate, and breathing patterns start returning to normal soon after quitting.   People who quit may see an improvement in their overall quality of life.  HOW DO YOU QUIT SMOKING? Smoking is an addiction with both physical and psychological effects, and longtime habits can be hard to change. Your health care provider can recommend:  Programs and community resources, which may include group support, education, or therapy.  Replacement products, such as patches, gum, and nasal sprays. Use these products only as directed. Do not replace cigarette smoking with electronic cigarettes (commonly called e-cigarettes). The safety of e-cigarettes is unknown, and some may contain harmful chemicals. FOR MORE INFORMATION  American Lung Association: www.lung.org  American Cancer Society: www.cancer.org   This information  is not intended to replace advice given to you by your health care provider. Make sure you discuss any questions you have with your health care provider.   Document Released: 03/15/2004 Document Revised: 11/26/2012  Document Reviewed: 07/28/2012 Elsevier Interactive Patient Education 2016 [employer].          Bupropion sustained-release tablets (smoking cessation) What is this medicine? BUPROPION (byoo PROE pee on) is used to help people quit smoking. This medicine may be used for other purposes; ask your health care provider or pharmacist if you have questions. What should I tell my health care provider before I take this medicine? They need to know if you have any of these conditions: -an eating disorder, such as anorexia or bulimia -bipolar disorder or psychosis -diabetes or high blood sugar, treated with medication -glaucoma -head injury or brain tumor -heart disease, previous heart attack, or irregular heart beat -high blood pressure -kidney or liver disease -seizures -suicidal thoughts or a previous suicide attempt -Tourette's syndrome -weight loss -an unusual or allergic reaction to bupropion, other medicines, foods, dyes, or preservatives -breast-feeding -pregnant or trying to become pregnant How should I use this medicine? Take this medicine by mouth with a glass of water. Follow the directions on the prescription label. You can take it with or without food. If it upsets your stomach, take it with food. Do not cut, crush or chew this medicine. Take your medicine at regular intervals. If you take this medicine more than once a day, take your second dose at least 8 hours after you take your first dose. To limit difficulty in sleeping, avoid taking this medicine at bedtime. Do not take your medicine more often than directed. Do not stop taking this medicine suddenly except upon the advice of your doctor. Stopping this medicine too quickly may cause serious side effects. A special MedGuide will be given to you by the pharmacist with each prescription and refill. Be sure to read this information carefully each time. Talk to your pediatrician regarding the use of this medicine in  children. Special care may be needed. Overdosage: If you think you have taken too much of this medicine contact a poison control center or emergency room at once. NOTE: This medicine is only for you. Do not share this medicine with others. What if I miss a dose? If you miss a dose, skip the missed dose and take your next tablet at the regular time. There should be at least 8 hours between doses. Do not take double or extra doses. What may interact with this medicine? Do not take this medicine with any of the following medications: -linezolid -MAOIs like Azilect, Carbex, Eldepryl, Marplan, Nardil, and Parnate -methylene blue (injected into a vein) -other medicines that contain bupropion like Wellbutrin This medicine may also interact with the following medications: -alcohol -certain medicines for anxiety or sleep -certain medicines for blood pressure like metoprolol, propranolol -certain medicines for depression or psychotic disturbances -certain medicines for HIV or AIDS like efavirenz, lopinavir, nelfinavir, ritonavir -certain medicines for irregular heart beat like propafenone, flecainide -certain medicines for Parkinson's disease like amantadine, levodopa -certain medicines for seizures like carbamazepine, phenytoin, phenobarbital -cimetidine -clopidogrel -cyclophosphamide -furazolidone -isoniazid -nicotine -orphenadrine -procarbazine -steroid medicines like prednisone or cortisone -stimulant medicines for attention disorders, weight loss, or to stay awake -tamoxifen -theophylline -thiotepa -ticlopidine -tramadol -warfarin This list may not describe all possible interactions. Give your health care provider a list of all the medicines, herbs, non-prescription drugs, or dietary supplements you use. Also  tell them if you smoke, drink alcohol, or use illegal drugs. Some items may interact with your medicine. What should I watch for while using this medicine? Visit your doctor or  health care professional for regular checks on your progress. This medicine should be used together with a patient support program. It is important to participate in a behavioral program, counseling, or other support program that is recommended by your health care professional. Patients and their families should watch out for new or worsening thoughts of suicide or depression. Also watch out for sudden changes in feelings such as feeling anxious, agitated, panicky, irritable, hostile, aggressive, impulsive, severely restless, overly excited and hyperactive, or not being able to sleep. If this happens, especially at the beginning of treatment or after a change in dose, call your health care professional. Avoid alcoholic drinks while taking this medicine. Drinking excessive alcoholic beverages, using sleeping or anxiety medicines, or quickly stopping the use of these agents while taking this medicine may increase your risk for a seizure. Do not drive or use heavy machinery until you know how this medicine affects you. This medicine can impair your ability to perform these tasks. Do not take this medicine close to bedtime. It may prevent you from sleeping. Your mouth may get dry. Chewing sugarless gum or sucking hard candy, and drinking plenty of water may help. Contact your doctor if the problem does not go away or is severe. Do not use nicotine patches or chewing gum without the advice of your doctor or health care professional while taking this medicine. You may need to have your blood pressure taken regularly if your doctor recommends that you use both nicotine and this medicine together. What side effects may I notice from receiving this medicine? Side effects that you should report to your doctor or health care professional as soon as possible: -allergic reactions like skin rash, itching or hives, swelling of the face, lips, or tongue -breathing problems -changes in vision -confusion -fast or  irregular heartbeat -hallucinations -increased blood pressure -redness, blistering, peeling or loosening of the skin, including inside the mouth -seizures -suicidal thoughts or other mood changes -unusually weak or tired -vomiting Side effects that usually do not require medical attention (report to your doctor or health care professional if they continue or are bothersome): -change in sex drive or performance -constipation -headache -loss of appetite -nausea -tremors -weight loss This list may not describe all possible side effects. Call your doctor for medical advice about side effects. You may report side effects to FDA at 1-800-FDA-1088. Where should I keep my medicine? Keep out of the reach of children. Store at room temperature between 20 and 25 degrees C (68 and 77 degrees F). Protect from light. Keep container tightly closed. Throw away any unused medicine after the expiration date. NOTE: This sheet is a summary. It may not cover all possible information. If you have questions about this medicine, talk to your doctor, pharmacist, or health care provider.    2016, Elsevier/Gold Standard. (2012-10-03 10:55:10)

## 2015-11-19 ENCOUNTER — Other Ambulatory Visit: Payer: Self-pay | Admitting: Family Medicine

## 2015-11-19 DIAGNOSIS — I10 Essential (primary) hypertension: Secondary | ICD-10-CM

## 2015-11-26 ENCOUNTER — Other Ambulatory Visit: Payer: Self-pay | Admitting: Family Medicine

## 2016-02-27 ENCOUNTER — Encounter: Payer: Self-pay | Admitting: Family Medicine

## 2016-05-14 ENCOUNTER — Other Ambulatory Visit: Payer: Self-pay | Admitting: Family Medicine

## 2016-06-01 ENCOUNTER — Encounter: Payer: Self-pay | Admitting: Family Medicine

## 2016-07-30 ENCOUNTER — Encounter: Payer: Self-pay | Admitting: Family Medicine

## 2016-08-03 ENCOUNTER — Other Ambulatory Visit: Payer: Self-pay | Admitting: Family Medicine

## 2016-08-04 ENCOUNTER — Other Ambulatory Visit: Payer: Self-pay | Admitting: Family Medicine

## 2016-08-06 ENCOUNTER — Ambulatory Visit (INDEPENDENT_AMBULATORY_CARE_PROVIDER_SITE_OTHER): Payer: Self-pay | Admitting: Family Medicine

## 2016-08-06 ENCOUNTER — Encounter: Payer: Self-pay | Admitting: Family Medicine

## 2016-08-06 VITALS — BP 120/80 | HR 71 | Temp 97.7°F | Resp 16 | Ht 72.0 in | Wt 242.0 lb

## 2016-08-06 DIAGNOSIS — Z125 Encounter for screening for malignant neoplasm of prostate: Secondary | ICD-10-CM

## 2016-08-06 DIAGNOSIS — F17210 Nicotine dependence, cigarettes, uncomplicated: Secondary | ICD-10-CM | POA: Insufficient documentation

## 2016-08-06 DIAGNOSIS — Z1211 Encounter for screening for malignant neoplasm of colon: Secondary | ICD-10-CM

## 2016-08-06 DIAGNOSIS — E119 Type 2 diabetes mellitus without complications: Secondary | ICD-10-CM

## 2016-08-06 DIAGNOSIS — Z0000 Encounter for general adult medical examination without abnormal findings: Secondary | ICD-10-CM

## 2016-08-06 DIAGNOSIS — I10 Essential (primary) hypertension: Secondary | ICD-10-CM

## 2016-08-06 DIAGNOSIS — E781 Pure hyperglyceridemia: Secondary | ICD-10-CM

## 2016-08-06 NOTE — Progress Notes (Signed)
Patient: Howard Sanders, Male    DOB: 03-Sep-1965, 51 y.o.   MRN: [id number] Visit Date: 08/06/2016  Today's Provider: Mila Merry, MD   Chief Complaint  Patient presents with  . Annual Exam  . Diabetes  . Hypertension   Subjective:    Annual physical exam Howard Sanders is a 51 y.o. male who presents today for health maintenance and complete physical. He feels well. He reports exercising some/work. He reports he is sleeping well.  ----------------------------------------------------------------    Diabetes Mellitus Type II, Follow-up:   Lab Results  Component Value Date   HGBA1C 6.6 10/25/2015   HGBA1C 6.1 02/22/2015   HGBA1C 6.8 (H) 11/10/2014   Last seen for diabetes 9 months ago.  Management since then includes; no changes, encouraged to exercise regularly. He reports good compliance with treatment. He is not having side effects. none Current symptoms include none and have been unchanged. Home blood sugar records: fasting range: not checking  Episodes of hypoglycemia? no   Current Insulin Regimen: n/a Most Recent Eye Exam: n/a Weight trend: stable Prior visit with dietician: no Current diet: in general, a "healthy" diet   Current exercise: some/work  ----------------------------------------------------------------   Hypertension, follow-up:  BP Readings from Last 3 Encounters:  08/06/16 120/80  10/25/15 102/64  02/22/15 130/84    He was last seen for hypertension 9 months ago.  BP at that visit was 102/64. Management since that visit includes; changed to HCTZ monotherapy, due to episodes of hypotension. Patient was having mild edema, so willuretic instead of ACEI.He reports good compliance with treatment. He is not having side effects. none He is exercising. He is adherent to low salt diet.   Outside blood pressures are not checking. He is experiencing none.  Patient denies none.   Cardiovascular risk factors include diabetes mellitus.    Use of agents associated with hypertension: none.   ----------------------------------------------------------------   Review of Systems  Constitutional: Negative for chills, diaphoresis and fever.  HENT: Negative for congestion, ear discharge, ear pain, hearing loss, nosebleeds, sore throat and tinnitus.   Eyes: Negative for photophobia, pain, discharge and redness.  Respiratory: Negative for cough, shortness of breath, wheezing and stridor.   Cardiovascular: Negative for chest pain, palpitations and leg swelling.  Gastrointestinal: Negative for abdominal pain, blood in stool, constipation, diarrhea, nausea and vomiting.  Endocrine: Negative for polydipsia.  Genitourinary: Negative for dysuria, flank pain, frequency, hematuria and urgency.  Musculoskeletal: Negative for back pain, myalgias and neck pain.  Skin: Negative for rash.  Allergic/Immunologic: Negative for environmental allergies.  Neurological: Negative for dizziness, tremors, seizures, weakness and headaches.  Hematological: Does not bruise/bleed easily.  Psychiatric/Behavioral: Negative for hallucinations and suicidal ideas. The patient is not nervous/anxious.   All other systems reviewed and are negative.   Social History      He  reports that he has been smoking Cigarettes.  He has a 30.00 pack-year smoking history. He has quit using smokeless tobacco. He reports that he does not drink alcohol.       Social History   Social History  . Marital status: Single    Spouse name: N/A  . Number of children: N/A  . Years of education: N/A   Social History Main Topics  . Smoking status: Current Every Day Smoker    Packs/day: 1.50    Years: 24.00    Types: Cigarettes  . Smokeless tobacco: Former Neurosurgeon     Comment: smoked 1 1/2 ppd since  age 51  . Alcohol use No  . Drug use: Unknown  . Sexual activity: Not Asked   Other Topics Concern  . None   Social History Narrative  . None    Past Medical History:   Diagnosis Date  . Hypertension      Patient Active Problem List   Diagnosis Date Noted  . Hypertriglyceridemia 11/03/2014  . Hypertension 11/03/2014  . Edema 11/03/2014  . Diabetes (HCC) 11/03/2014    Past Surgical History:  Procedure Laterality Date  . ELBOW SURGERY Left 1981   Due to fractured done  . NECK SURGERY     swollen lymph nodes removed on each side of neck    Family History        Family Status  Relation Status  . Mother Deceased       cause of death Lymphoma  . Father Deceased  . Brother (Not Specified)       pacemaker, alcoholic,drug addict, depression, mental illness  . MGM Deceased at age 870       Cause of death Thyroid cancer        His family history includes Alcohol abuse in his father.     Allergies  Allergen Reactions  . Bee Venom Anaphylaxis  . Iodine Anaphylaxis     Current Outpatient Prescriptions:  .  furosemide (LASIX) 20 MG tablet, TAKE 1 TABLET BY MOUTH EVERY DAY AS NEEDED FOR SWELLING, Disp: 30 tablet, Rfl: 0 .  lisinopril-hydrochlorothiazide (PRINZIDE,ZESTORETIC) 10-12.5 MG tablet, TAKE 1 TABLET EVERY DAY, Disp: 90 tablet, Rfl: 3 .  lovastatin (MEVACOR) 40 MG tablet, TAKE 1 TABLET (40 MG TOTAL) BY MOUTH AT BEDTIME., Disp: 30 tablet, Rfl: 0 .  metFORMIN (GLUCOPHAGE-XR) 500 MG 24 hr tablet, Take 1 tablet (500 mg total) by mouth daily with breakfast., Disp: 90 tablet, Rfl: 4   Patient Care Team: Malva LimesFisher, Donald E, MD as PCP - General (Family Medicine)      Objective:   Vitals: BP 120/80 (BP Location: Right Arm, Patient Position: Sitting, Cuff Size: Large)   Pulse 71   Temp 97.7 F (36.5 C) (Oral)   Resp 16   Ht 6' (1.829 m)   Wt 242 lb (109.8 kg)   SpO2 96%   BMI 32.82 kg/m    Vitals:   08/06/16 0927  BP: 120/80  Pulse: 71  Resp: 16  Temp: 97.7 F (36.5 C)  TempSrc: Oral  SpO2: 96%  Weight: 242 lb (109.8 kg)  Height: 6' (1.829 m)     Physical Exam   General Appearance:    Alert, cooperative, no distress,  appears stated age  Head:    Normocephalic, without obvious abnormality, atraumatic  Eyes:    PERRL, conjunctiva/corneas clear, EOM's intact, fundi    benign, both eyes       Ears:    Normal TM's and external ear canals, both ears  Nose:   Nares normal, septum midline, mucosa normal, no drainage   or sinus tenderness  Throat:   Lips, mucosa, and tongue normal; teeth and gums normal  Neck:   Supple, symmetrical, trachea midline, no adenopathy;       thyroid:  No enlargement/tenderness/nodules; no carotid   bruit or JVD  Back:     Symmetric, no curvature, ROM normal, no CVA tenderness  Lungs:     Clear to auscultation bilaterally, respirations unlabored  Chest wall:    No tenderness or deformity  Heart:    Regular rate and rhythm, S1 and S2  normal, no murmur, rub   or gallop  Abdomen:     Soft, non-tender, bowel sounds active all four quadrants,    no masses, no organomegaly  Genitalia:    deferred  Rectal:    deferred  Extremities:   Extremities normal, atraumatic, no cyanosis or edema  Pulses:   2+ and symmetric all extremities  Skin:   Skin color, texture, turgor normal, no rashes or lesions  Lymph nodes:   Cervical, supraclavicular, and axillary nodes normal  Neurologic:   CNII-XII intact. Normal strength, sensation and reflexes      throughout    Depression Screen PHQ 2/9 Scores 08/06/2016 12/20/2014  PHQ - 2 Score 0 0  PHQ- 9 Score 0 0      Assessment & Plan:     Routine Health Maintenance and Physical Exam  Exercise Activities and Dietary recommendations Goals    None      Immunization History  Administered Date(s) Administered  . Tdap 02/19/2014    Health Maintenance  Topic Date Due  . PNEUMOCOCCAL POLYSACCHARIDE VACCINE (1) 02/23/1968  . FOOT EXAM  02/23/1976  . OPHTHALMOLOGY EXAM  02/23/1976  . HIV Screening  02/22/1981  . COLONOSCOPY  02/23/2016  . HEMOGLOBIN A1C  04/23/2016  . INFLUENZA VACCINE  10/24/2016 (Originally 09/19/2016)  . TETANUS/TDAP   02/20/2024     Discussed health benefits of physical activity, and encouraged him to engage in regular exercise appropriate for his age and condition.    --------------------------------------------------------------------  1. Annual physical exam   2. Essential hypertension Well controlled.  Continue current medications.   - Comprehensive metabolic panel - Lipid panel - Hemoglobin A1c  3. Type 2 diabetes mellitus without complication, without long-term current use of insulin (HCC) Diet controlled.   4. Hypertriglyceridemia Check Lipids.   5. Prostate cancer screening  - PSA  6. Colon cancer screening   7. Smoking greater than 30 pack years Counseled regarding LDCT that he will be due for when he turns 56. For now he needs to stop smoking.    Mila Merry, MD  [facility]

## 2016-08-06 NOTE — Patient Instructions (Addendum)

## 2016-08-07 LAB — LIPID PANEL
CHOLESTEROL TOTAL: 161 mg/dL (ref 100–199)
Chol/HDL Ratio: 7 ratio — ABNORMAL HIGH (ref 0.0–5.0)
HDL: 23 mg/dL — ABNORMAL LOW (ref >39)
Triglycerides: 411 mg/dL — ABNORMAL HIGH (ref 0–149)

## 2016-08-07 LAB — COMPREHENSIVE METABOLIC PANEL
A/G RATIO: 1.6 (ref 1.2–2.2)
ALBUMIN: 4.1 g/dL (ref 3.5–5.5)
ALK PHOS: 106 IU/L (ref 39–117)
ALT: 29 IU/L (ref 0–44)
AST: 24 IU/L (ref 0–40)
BILIRUBIN TOTAL: 0.5 mg/dL (ref 0.0–1.2)
BUN / CREAT RATIO: 13 (ref 9–20)
BUN: 13 mg/dL (ref 6–24)
CO2: 25 mmol/L (ref 20–29)
Calcium: 9.2 mg/dL (ref 8.7–10.2)
Chloride: 98 mmol/L (ref 96–106)
Creatinine, Ser: 1 mg/dL (ref 0.76–1.27)
GFR calc non Af Amer: 87 mL/min/{1.73_m2} (ref >59)
GFR, EST AFRICAN AMERICAN: 101 mL/min/{1.73_m2} (ref >59)
Globulin, Total: 2.5 g/dL (ref 1.5–4.5)
Glucose: 134 mg/dL — ABNORMAL HIGH (ref 65–99)
POTASSIUM: 4.3 mmol/L (ref 3.5–5.2)
SODIUM: 137 mmol/L (ref 134–144)
TOTAL PROTEIN: 6.6 g/dL (ref 6.0–8.5)

## 2016-08-07 LAB — HEMOGLOBIN A1C
ESTIMATED AVERAGE GLUCOSE: 157 mg/dL
Hgb A1c MFr Bld: 7.1 % — ABNORMAL HIGH (ref 4.8–5.6)

## 2016-08-07 LAB — PSA: PROSTATE SPECIFIC AG, SERUM: 0.8 ng/mL (ref 0.0–4.0)

## 2016-08-09 ENCOUNTER — Telehealth (INDEPENDENT_AMBULATORY_CARE_PROVIDER_SITE_OTHER): Payer: Self-pay | Admitting: *Deleted

## 2016-08-09 ENCOUNTER — Encounter: Payer: Self-pay | Admitting: Family Medicine

## 2016-08-09 ENCOUNTER — Other Ambulatory Visit: Payer: Self-pay | Admitting: *Deleted

## 2016-08-09 DIAGNOSIS — I10 Essential (primary) hypertension: Secondary | ICD-10-CM

## 2016-08-09 DIAGNOSIS — R195 Other fecal abnormalities: Secondary | ICD-10-CM | POA: Insufficient documentation

## 2016-08-09 DIAGNOSIS — Z1211 Encounter for screening for malignant neoplasm of colon: Secondary | ICD-10-CM

## 2016-08-09 LAB — IFOBT (OCCULT BLOOD): IFOBT: POSITIVE

## 2016-08-09 MED ORDER — METFORMIN HCL ER 500 MG PO TB24
500.0000 mg | ORAL_TABLET | Freq: Two times a day (BID) | ORAL | 1 refills | Status: DC
Start: 2016-08-09 — End: 2016-08-10

## 2016-08-09 NOTE — Telephone Encounter (Signed)
-----   Message from Malva Limesonald E Fisher, MD sent at 08/07/2016  7:08 AM EDT ----- Triglycerides are high at 411. a1c is up to 7.1. Need to increase metformin ER to 500mg  BID, #60 rf x 3 and schedule follow up for diabetes in 3-4 months. Rest of labs are normal.

## 2016-08-09 NOTE — Telephone Encounter (Signed)
-----   Message from Malva Limesonald E Fisher, MD sent at 08/09/2016  1:57 PM EDT ----- Stool is positive for blood. He needs colonoscopy for follow up and screening.

## 2016-08-09 NOTE — Telephone Encounter (Signed)
Please schedule referral to gastro. Thanks!

## 2016-08-09 NOTE — Telephone Encounter (Signed)
Patient was notified of results. Expressed understanding. Rx sent to pharmacy. 

## 2016-08-10 MED ORDER — METFORMIN HCL ER 500 MG PO TB24: 500.0000 mg | ORAL_TABLET | Freq: Two times a day (BID) | ORAL | 1 refills | Status: DC

## 2016-08-10 MED ORDER — FUROSEMIDE 20 MG PO TABS: ORAL_TABLET | ORAL | 3 refills | Status: DC

## 2016-08-10 MED ORDER — LOVASTATIN 40 MG PO TABS: ORAL_TABLET | ORAL | 3 refills | Status: DC

## 2016-08-10 MED ORDER — LISINOPRIL-HYDROCHLOROTHIAZIDE 10-12.5 MG PO TABS: 1.0000 | ORAL_TABLET | Freq: Every day | ORAL | 3 refills | Status: DC

## 2016-08-17 ENCOUNTER — Telehealth: Payer: Self-pay | Admitting: Family Medicine

## 2016-08-17 NOTE — Telephone Encounter (Signed)
He can go back to 500 mg of metformin once daily. Please follow up with Dr. Sherrie MustacheFisher 3 mo from last visit.

## 2016-08-17 NOTE — Telephone Encounter (Signed)
The increase in the metformin is causing him to have aches and pains.  Pt states he can not take the 1000 mg dose.  Please advise  [phone]  Thanks Howard Sanders

## 2016-08-17 NOTE — Telephone Encounter (Signed)
Please review-aa 

## 2016-08-17 NOTE — Telephone Encounter (Signed)
Spoke with patient. Patient states he is hurting all over bad, his stomach is hurting, he is having decrease urine output, his b/p right now is 87/56. He feels terrible he states. Patient is out of town in [place]. Advised patient to get to urgent care or ER be seen in case something else is causing him to feel bad or having other health issue. Patient Kathyrn Lassunderstood-aa

## 2016-12-03 ENCOUNTER — Ambulatory Visit: Payer: Self-pay | Admitting: Family Medicine

## 2017-09-05 ENCOUNTER — Other Ambulatory Visit: Payer: Self-pay | Admitting: Family Medicine

## 2017-09-05 MED ORDER — FUROSEMIDE 20 MG PO TABS: ORAL_TABLET | ORAL | 0 refills | Status: DC

## 2017-09-05 NOTE — Telephone Encounter (Signed)
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  I told him it has been since 6/18 since he was last in  Thanks  teri

## 2017-09-30 ENCOUNTER — Other Ambulatory Visit: Payer: Self-pay | Admitting: Family Medicine

## 2017-10-07 ENCOUNTER — Ambulatory Visit (INDEPENDENT_AMBULATORY_CARE_PROVIDER_SITE_OTHER): Payer: Self-pay | Admitting: Family Medicine

## 2017-10-07 ENCOUNTER — Other Ambulatory Visit: Payer: Self-pay | Admitting: Family Medicine

## 2017-10-07 ENCOUNTER — Encounter: Payer: Self-pay | Admitting: Family Medicine

## 2017-10-07 VITALS — BP 123/80 | HR 85 | Temp 98.2°F | Resp 16 | Ht 71.0 in | Wt 233.0 lb

## 2017-10-07 DIAGNOSIS — E119 Type 2 diabetes mellitus without complications: Secondary | ICD-10-CM

## 2017-10-07 DIAGNOSIS — I10 Essential (primary) hypertension: Secondary | ICD-10-CM

## 2017-10-07 DIAGNOSIS — K760 Fatty (change of) liver, not elsewhere classified: Secondary | ICD-10-CM | POA: Insufficient documentation

## 2017-10-07 DIAGNOSIS — E781 Pure hyperglyceridemia: Secondary | ICD-10-CM

## 2017-10-07 LAB — POCT GLYCOSYLATED HEMOGLOBIN (HGB A1C)
Est. average glucose Bld gHb Est-mCnc: 151
HEMOGLOBIN A1C: 6.9 % — AB (ref 4.0–5.6)

## 2017-10-07 LAB — POCT UA - MICROALBUMIN: Microalbumin Ur, POC: 20 mg/L

## 2017-10-07 MED ORDER — LISINOPRIL-HYDROCHLOROTHIAZIDE 10-12.5 MG PO TABS: 1.0000 | ORAL_TABLET | Freq: Every day | ORAL | 3 refills | Status: DC

## 2017-10-07 MED ORDER — FUROSEMIDE 20 MG PO TABS: ORAL_TABLET | ORAL | 1 refills | Status: DC

## 2017-10-07 NOTE — Telephone Encounter (Signed)
[id number] [place], [place] - [id number]  Faxed a refill request for the following medication. Thanks CC  furosemide (LASIX) 20 MG tablet

## 2017-10-07 NOTE — Telephone Encounter (Signed)
Rx was sent to pharmacy today. 

## 2017-10-07 NOTE — Progress Notes (Signed)
Patient: Howard Sanders Male    DOB: 11/25/1965   [age] y.o.   MRN: [id number] Visit Date: 10/07/2017  Today's Provider: Mila Merryonald Fisher, MD   Chief Complaint  Patient presents with  . Follow-up  . Diabetes  . Hypertension  . Hyperlipidemia   Subjective:    HPI   Diabetes Mellitus Type II, Follow-up:   Lab Results  Component Value Date   HGBA1C 6.9 (A) 10/07/2017   HGBA1C 7.1 (H) 08/06/2016   HGBA1C 6.6 10/25/2015   Last seen for diabetes 08/06/2016.  Management since then includes; labs checked. Increased metformin ER to 500 mg bid.  He started feeling poorly several days after increasing metformin dose and [facility] where he was diagnosed with dehydration and fatty liver. Since then he stopped metformin and has been following low carb high protein, semi keto diet. He states he feels much better and had lost about 30 pounds, although he has gained some of it back.   ---------------------------------------------------------------   Hypertension, follow-up:  BP Readings from Last 3 Encounters:  10/07/17 123/80  08/06/16 120/80  10/25/15 102/64    He was last seen for hypertension 08/06/2016.  BP at that visit was 120/80. Management since that visit includes; no changes.He reports good compliance with treatment. He is not having side effects. none He is exercising. He is adherent to low salt diet.   Outside blood pressures are 127/70. He is experiencing none.  Patient denies none.   Cardiovascular risk factors include diabetes mellitus.  Use of agents associated with hypertension: none.   ---------------------------------------------------------------------   Lipid/Cholesterol, Follow-up:   Last seen for this 08/06/2016.  Management since that visit includes; labs checked, no changes.  Last Lipid Panel:    Component Value Date/Time   CHOL 161 08/06/2016 1016   TRIG 411 (H) 08/06/2016 1016   HDL 23 (L) 08/06/2016 1016   CHOLHDL 7.0 (H) 08/06/2016  1016   LDLCALC Comment 08/06/2016 1016    He reports fair compliance with treatment. He is not having side effects. none  Wt Readings from Last 3 Encounters:  10/07/17 233 lb (105.7 kg)  08/06/16 242 lb (109.8 kg)  10/25/15 234 lb (106.1 kg)    ---------------------------------------------------------------    Allergies  Allergen Reactions  . Bee Venom Anaphylaxis  . Iodine Anaphylaxis     Current Outpatient Medications:  .  furosemide (LASIX) 20 MG tablet, TAKE 1 TABLET BY MOUTH EVERY DAY AS NEEDED FOR SWELLING, Disp: 30 tablet, Rfl: 0 .  lisinopril-hydrochlorothiazide (PRINZIDE,ZESTORETIC) 10-12.5 MG tablet, Take 1 tablet by mouth daily., Disp: 90 tablet, Rfl: 3 .  lovastatin (MEVACOR) 40 MG tablet, TAKE 1 TABLET (40 MG TOTAL) BY MOUTH AT BEDTIME. (Patient not taking: Reported on 10/07/2017), Disp: 90 tablet, Rfl: 3 .  metFORMIN (GLUCOPHAGE-XR) 500 MG 24 hr tablet, Take 1 tablet (500 mg total) by mouth 2 (two) times daily. (Patient not taking: Reported on 10/07/2017), Disp: 180 tablet, Rfl: 1  Review of Systems  Constitutional: Negative for appetite change, chills and fever.  Respiratory: Negative for chest tightness, shortness of breath and wheezing.   Cardiovascular: Negative for chest pain and palpitations.  Gastrointestinal: Negative for abdominal pain, nausea and vomiting.    Social History   Tobacco Use  . Smoking status: Current Every Day Smoker    Packs/day: 1.50    Years: 24.00    Pack years: 36.00    Types: Cigarettes  . Smokeless tobacco: Former NeurosurgeonUser  . Tobacco  comment: smoked 1 1/2 ppd since age 52  Substance Use Topics  . Alcohol use: No   Objective:   BP 123/80 (BP Location: Right Arm, Patient Position: Sitting, Cuff Size: Large)   Pulse 85   Temp 98.2 F (36.8 C) (Oral)   Resp 16   Ht 5\' 11"  (1.803 m)   Wt 233 lb (105.7 kg)   SpO2 96%   BMI 32.50 kg/m  Vitals:   10/07/17 1018  BP: 123/80  Pulse: 85  Resp: 16  Temp: 98.2 F (36.8 C)    TempSrc: Oral  SpO2: 96%  Weight: 233 lb (105.7 kg)  Height: 5\' 11"  (1.803 m)     Physical Exam  General Appearance:    Alert, cooperative, no distress, obese  Eyes:    PERRL, conjunctiva/corneas clear, EOM's intact       Lungs:     Clear to auscultation bilaterally, respirations unlabored  Heart:    Regular rate and rhythm  Neurologic:   Awake, alert, oriented x 3. No apparent focal neurological           defect.         Results for orders placed or performed in visit on 10/07/17  POCT glycosylated hemoglobin (Hb A1C)  Result Value Ref Range   Hemoglobin A1C 6.9 (A) 4.0 - 5.6 %   HbA1c POC (<> result, manual entry)     HbA1c, POC (prediabetic range)     HbA1c, POC (controlled diabetic range)     Est. average glucose Bld gHb Est-mCnc 151   POCT UA - Microalbumin  Result Value Ref Range   Microalbumin Ur, POC 20 mg/L       Assessment & Plan:     1. Type 2 diabetes mellitus without complication, without long-term current use of insulin (HCC) Well controlled with diet. Patient is convenced metformin was responsible for swelling he had last year and does not want to take any diabetic medications.  - POCT glycosylated hemoglobin (Hb A1C) - POCT UA - Microalbumin  2. Hypertriglyceridemia Now off of lovastatin. See how lipids look.  - Comprehensive metabolic panel - Lipid panel  3. Essential hypertension Well controlled.  Continue current medications.   - lisinopril-hydrochlorothiazide (PRINZIDE,ZESTORETIC) 10-12.5 MG tablet; Take 1 tablet by mouth daily.  Dispense: 90 tablet; Refill: 3  4. Fatty liver On low carb diet. Labs pending.        Mila Merryonald Fisher, MD  [facility]

## 2018-05-30 ENCOUNTER — Other Ambulatory Visit: Payer: Self-pay | Admitting: Family Medicine

## 2018-06-03 ENCOUNTER — Other Ambulatory Visit: Payer: Self-pay | Admitting: Family Medicine

## 2018-06-03 NOTE — Telephone Encounter (Signed)
[facility] [place] [facility]. Need to cancel that prescription, then [facility] [address]

## 2018-06-03 NOTE — Telephone Encounter (Signed)
Pt needs a refill on Furosemide 20 mg  [facility] [address]  Thanks  teri

## 2018-06-04 NOTE — Telephone Encounter (Signed)
[facility] for [facility] to pick up. Patient was advised.

## 2018-11-20 ENCOUNTER — Other Ambulatory Visit: Payer: Self-pay | Admitting: Family Medicine

## 2018-11-20 DIAGNOSIS — I10 Essential (primary) hypertension: Secondary | ICD-10-CM

## 2018-11-21 ENCOUNTER — Other Ambulatory Visit: Payer: Self-pay

## 2018-11-21 ENCOUNTER — Ambulatory Visit (INDEPENDENT_AMBULATORY_CARE_PROVIDER_SITE_OTHER): Payer: Self-pay | Admitting: Family Medicine

## 2018-11-21 ENCOUNTER — Encounter: Payer: Self-pay | Admitting: Family Medicine

## 2018-11-21 VITALS — BP 178/102 | HR 90 | Temp 96.9°F | Wt 226.0 lb

## 2018-11-21 DIAGNOSIS — K760 Fatty (change of) liver, not elsewhere classified: Secondary | ICD-10-CM

## 2018-11-21 DIAGNOSIS — E781 Pure hyperglyceridemia: Secondary | ICD-10-CM

## 2018-11-21 DIAGNOSIS — M1A09X1 Idiopathic chronic gout, multiple sites, with tophus (tophi): Secondary | ICD-10-CM

## 2018-11-21 DIAGNOSIS — I10 Essential (primary) hypertension: Secondary | ICD-10-CM

## 2018-11-21 DIAGNOSIS — E119 Type 2 diabetes mellitus without complications: Secondary | ICD-10-CM

## 2018-11-21 MED ORDER — PREDNISONE 10 MG PO TABS: ORAL_TABLET | ORAL | 0 refills | Status: DC

## 2018-11-21 NOTE — Progress Notes (Signed)
Howard Sanders  MRN: [id number] DOB: 12/22/1965  Subjective:  HPI   The patient is a 53 year old male who presents for evaluation of gout.  He has a history of having left leg swelling in April that was diagnosed as cellulitis.  He states he was seen at one time when they wanted to drain the fluid off his left knee.  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  Recently he was seen at [facility] and was told he had gout.   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  Patient Active Problem List   Diagnosis Date Noted  . Fatty liver 10/07/2017  . Occult blood in stools 08/09/2016  . Smoking greater than 30 pack years 08/06/2016  . Hypertriglyceridemia 11/03/2014  . Hypertension 11/03/2014  . Edema 11/03/2014  . Diabetes (HCC) 11/03/2014   Past Medical History:  Diagnosis Date  . Hypertension    Past Surgical History:  Procedure Laterality Date  . ELBOW SURGERY Left 1981   Due to fractured done  . NECK SURGERY     swollen lymph nodes removed on each side of neck   Social History   Socioeconomic History  . Marital status: Single    Spouse name: Not on file  . Number of children: Not on file  . Years of education: Not on file  . Highest education level: Not on file  Occupational History  . Occupation: Flat bed truck driver  Social Needs  . Financial resource strain: Not on file  . Food insecurity    Worry: Not on file    Inability: Not on file  . Transportation needs    Medical: Not on file    Non-medical: Not on file  Tobacco Use  . Smoking status: Current Every Day Smoker    Packs/day: 1.50    Years: 24.00    Pack years: 36.00    Types: Cigarettes  . Smokeless tobacco: Former NeurosurgeonUser  . Tobacco comment: smoked 1 1/2 ppd since age 53  Substance and Sexual Activity  . Alcohol use: No  . Drug use: Not on file  . Sexual  activity: Not on file  Lifestyle  . Physical activity    Days per week: Not on file    Minutes per session: Not on file  . Stress: Not on file  Relationships  . Social Musicianconnections    Talks on phone: Not on file    Gets together: Not on file    Attends religious service: Not on file    Active member of club or organization: Not on file    Attends meetings of clubs or organizations: Not on file    Relationship status: Not on file  . Intimate partner violence    Fear of current or ex partner: Not on file    Emotionally abused: Not on file    Physically abused: Not on file    Forced sexual activity: Not on file  Other Topics Concern  . Not on file  Social History Narrative  . Not on file   Outpatient Encounter Medications as of 11/21/2018  Medication Sig  . lisinopril-hydrochlorothiazide (ZESTORETIC) 10-12.5 MG tablet TAKE 1 TABLET BY MOUTH EVERY DAY  . furosemide (LASIX) 20 MG tablet TAKE 1 TABLET BY MOUTH EVERY DAY AS NEEDED FOR  SWELLING (Patient not taking: Reported on 11/21/2018)  . lovastatin (MEVACOR) 40 MG tablet TAKE 1 TABLET (40 MG TOTAL) BY MOUTH AT BEDTIME. (Patient not taking: Reported on 10/07/2017)   No facility-administered encounter medications on file as of 11/21/2018.    Allergies  Allergen Reactions  . Bee Venom Anaphylaxis  . Iodine Anaphylaxis   Review of Systems  Constitutional: Negative for chills, diaphoresis, fever and malaise/fatigue.  HENT: Negative for congestion, ear pain and sore throat.   Respiratory: Negative for cough and shortness of breath.   Cardiovascular: Negative for chest pain.  Gastrointestinal: Negative for abdominal pain and diarrhea.  Musculoskeletal: Positive for joint pain. Negative for myalgias.  Neurological: Negative for headaches.    Objective:  BP (!) 178/102 (BP Location: Right Arm, Patient Position: Sitting, Cuff Size: Normal)   Pulse 90   Temp (!) 96.9 F (36.1 C) (Skin)   Wt 226 lb (102.5 kg)   SpO2 99%   BMI 31.52  kg/m   Wt Readings from Last 3 Encounters:  11/21/18 226 lb (102.5 kg)  10/07/17 233 lb (105.7 kg)  08/06/16 242 lb (109.8 kg)   BP Readings from Last 3 Encounters:  11/21/18 (!) 178/102  10/07/17 123/80  08/06/16 120/80   Physical Exam  Constitutional: He is oriented to person, place, and time and well-developed, well-nourished, and in no distress.  HENT:  Head: Normocephalic.  Eyes: Conjunctivae are normal.  Neck: Neck supple.  Cardiovascular: Normal rate and regular rhythm.  Pulmonary/Chest: Effort normal.  Coarse breath sounds with questionable rhonchi that clears with cough.  Abdominal: Soft. Bowel sounds are normal.  Musculoskeletal:        General: Tenderness present.     Comments: Some redness and stiffness in the right wrist. Minimal flexion and extension ability with tenderness. Nodule in the right olecranon area and swelling with tenderness of the right knee.  Neurological: He is alert and oriented to person, place, and time.  Skin: No rash noted.  Psychiatric: Mood, affect and judgment normal.    Assessment and Plan :   1. Idiopathic chronic gout of multiple sites with tophus Has had swelling and redness of the right wrist and elbow the past 2-3 months intermittently. Also, very stiff right wrist and some swelling of the right knee today. Was treated with a steroid injection at the [facility] 2 months ago. This helped all the symptoms to abate for a short time. No fever. States he has had similar episode in the right great toe a year or two ago. Will give prednisone taper and handout on gout with purine restrictions for diet. May need Allopurinol to reduce gout attacks or referral to a rheumatologist. - CBC with Differential/Platelet - Uric acid - predniSONE (DELTASONE) 10 MG tablet; Taper down by 1 tablets daily starting at 6 by mouth day 1, 5 day 2, 4 day 3, 3 day 4, 2 day 5 and 1 on day 6. Divide daily dose among meals each day.  Dispense: 21 tablet; Refill: 0  2.  Essential hypertension BP very high today. Has been off the Lisinopril/HCTZ 10-12.5 mg qd because he feels BP has been 126/78 to 132/83 at home. 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 Recheck CBC and CMP. Recommend he take the Lisinopril/HCTZ daily and if BP goes too low, change to only the Lisinopril 10 mg qd. - CBC with Differential/Platelet - Comprehensive metabolic panel  3. Hypertriglyceridemia Lipids on 08-06-16 showed triglycerides 411 and HDL 23 with total cholesterol 161. Trying to restrict fats in diet. Will recheck labs and encouraged to follow diet.  - Comprehensive metabolic panel - Lipid panel  4. Fatty liver Diagnosed by CT of abdomen and pelvis on 08-18-16 at [facility]. Has been reducing weight and limiting fats in diet. Will recheck CMP and Lipid Panel. - Comprehensive metabolic panel - Lipid panel  5. Type 2 diabetes mellitus without complication, without long-term current use of insulin (HCC) Has been off the Metformin for the past year. States he was told by a doctor that "diabetes is a diet problem, not a disease". He continues to try to control it with diet alone. Denies polyuria, polydipsia, polyphagia, peripheral neuropathy or vision changes. Will check routine follow up labs. - CBC with Differential/Platelet - Comprehensive metabolic panel - Hemoglobin A1c

## 2018-11-21 NOTE — Patient Instructions (Signed)
Low-Purine Eating Plan A low-purine eating plan involves making food choices to limit your intake of purine. Purine is a kind of uric acid. Too much uric acid in your blood can cause certain conditions, such as gout and kidney stones. Eating a low-purine diet can help control these conditions. What are tips for following this plan? Reading food labels   Avoid foods with saturated or Trans fat.  Check the ingredient list of grains-based foods, such as bread and cereal, to make sure that they contain whole grains.  Check the ingredient list of sauces or soups to make sure they do not contain meat or fish.  When choosing soft drinks, check the ingredient list to make sure they do not contain high-fructose corn syrup. Shopping  Buy plenty of fresh fruits and vegetables.  Avoid buying canned or fresh fish.  Buy dairy products labeled as low-fat or nonfat.  Avoid buying premade or processed foods. These foods are often high in fat, salt (sodium), and added sugar. Cooking  Use olive oil instead of butter when cooking. Oils like olive oil, canola oil, and sunflower oil contain healthy fats. Meal planning  Learn which foods do or do not affect you. If you find out that a food tends to cause your gout symptoms to flare up, avoid eating that food. You can enjoy foods that do not cause problems. If you have any questions about a food item, talk with your dietitian or health care provider.  Limit foods high in fat, especially saturated fat. Fat makes it harder for your body to get rid of uric acid.  Choose foods that are lower in fat and are lean sources of protein. General guidelines  Limit alcohol intake to no more than 1 drink a day for nonpregnant women and 2 drinks a day for men. One drink equals 12 oz of beer, 5 oz of wine, or 1 oz of hard liquor. Alcohol can affect the way your body gets rid of uric acid.  Drink plenty of water to keep your urine clear or pale yellow. Fluids can help  remove uric acid from your body.  If directed by your health care provider, take a vitamin C supplement.  Work with your health care provider and dietitian to develop a plan to achieve or maintain a healthy weight. Losing weight can help reduce uric acid in your blood. What foods are recommended? The items listed may not be a complete list. Talk with your dietitian about what dietary choices are best for you. Foods low in purines Foods low in purines do not need to be limited. These include:  All fruits.  All low-purine vegetables, pickles, and olives.  Breads, pasta, rice, cornbread, and popcorn. Cake and other baked goods.  All dairy foods.  Eggs, nuts, and nut butters.  Spices and condiments, such as salt, herbs, and vinegar.  Plant oils, butter, and margarine.  Water, sugar-free soft drinks, tea, coffee, and cocoa.  Vegetable-based soups, broths, sauces, and gravies. Foods moderate in purines Foods moderate in purines should be limited to the amounts listed.   cup of asparagus, cauliflower, spinach, mushrooms, or green peas, each day.  2/3 cup uncooked oatmeal, each day.   cup dry wheat bran or wheat germ, each day.  2-3 ounces of meat or poultry, each day.  4-6 ounces of shellfish, such as crab, lobster, oysters, or shrimp, each day.  1 cup cooked beans, peas, or lentils, each day.  Soup, broths, or bouillon made from meat or   fish. Limit these foods as much as possible. What foods are not recommended? The items listed may not be a complete list. Talk with your dietitian about what dietary choices are best for you. Limit your intake of foods high in purines, including:  Beer and other alcohol.  Meat-based gravy or sauce.  Canned or fresh fish, such as: ? Anchovies, sardines, herring, and tuna. ? Mussels and scallops. ? Codfish, trout, and haddock.  Bacon.  Organ meats, such as: ? Liver or kidney. ? Tripe. ? Sweetbreads (thymus gland or  pancreas).  Wild game or goose.  Yeast or yeast extract supplements.  Drinks sweetened with high-fructose corn syrup. Summary  Eating a low-purine diet can help control conditions caused by too much uric acid in the body, such as gout or kidney stones.  Choose low-purine foods, limit alcohol, and limit foods high in fat.  You will learn over time which foods do or do not affect you. If you find out that a food tends to cause your gout symptoms to flare up, avoid eating that food. This information is not intended to replace advice given to you by your health care provider. Make sure you discuss any questions you have with your health care provider. Document Released: 06/02/2010 Document Revised: 01/18/2017 Document Reviewed: 03/21/2016 Elsevier Patient Education  2020 Elsevier Inc.  Gout  Gout is a condition that causes painful swelling of the joints. Gout is a type of inflammation of the joints (arthritis). This condition is caused by having too much uric acid in the body. Uric acid is a chemical that forms when the body breaks down substances called purines. Purines are important for building body proteins. When the body has too much uric acid, sharp crystals can form and build up inside the joints. This causes pain and swelling. Gout attacks can happen quickly and may be very painful (acute gout). Over time, the attacks can affect more joints and become more frequent (chronic gout). Gout can also cause uric acid to build up under the skin and inside the kidneys. What are the causes? This condition is caused by too much uric acid in your blood. This can happen because:  Your kidneys do not remove enough uric acid from your blood. This is the most common cause.  Your body makes too much uric acid. This can happen with some cancers and cancer treatments. It can also occur if your body is breaking down too many red blood cells (hemolytic anemia).  You eat too many foods that are high in  purines. These foods include organ meats and some seafood. Alcohol, especially beer, is also high in purines. A gout attack may be triggered by trauma or stress. What increases the risk? You are more likely to develop this condition if you:  Have a family history of gout.  Are male and middle-aged.  Are male and have gone through menopause.  Are obese.  Frequently drink alcohol, especially beer.  Are dehydrated.  Lose weight too quickly.  Have an organ transplant.  Have lead poisoning.  Take certain medicines, including aspirin, cyclosporine, diuretics, levodopa, and niacin.  Have kidney disease.  Have a skin condition called psoriasis. What are the signs or symptoms? An attack of acute gout happens quickly. It usually occurs in just one joint. The most common place is the big toe. Attacks often start at night. Other joints that may be affected include joints of the feet, ankle, knee, fingers, wrist, or elbow. Symptoms of this condition may   include:  Severe pain.  Warmth.  Swelling.  Stiffness.  Tenderness. The affected joint may be very painful to touch.  Shiny, red, or purple skin.  Chills and fever. Chronic gout may cause symptoms more frequently. More joints may be involved. You may also have white or yellow lumps (tophi) on your hands or feet or in other areas near your joints. How is this diagnosed? This condition is diagnosed based on your symptoms, medical history, and physical exam. You may have tests, such as:  Blood tests to measure uric acid levels.  Removal of joint fluid with a thin needle (aspiration) to look for uric acid crystals.  X-rays to look for joint damage. How is this treated? Treatment for this condition has two phases: treating an acute attack and preventing future attacks. Acute gout treatment may include medicines to reduce pain and swelling, including:  NSAIDs.  Steroids. These are strong anti-inflammatory medicines that can be  taken by mouth (orally) or injected into a joint.  Colchicine. This medicine relieves pain and swelling when it is taken soon after an attack. It can be given by mouth or through an IV. Preventive treatment may include:  Daily use of smaller doses of NSAIDs or colchicine.  Use of a medicine that reduces uric acid levels in your blood.  Changes to your diet. You may need to see a dietitian about what to eat and drink to prevent gout. Follow these instructions at home: During a gout attack   If directed, put ice on the affected area: ? Put ice in a plastic bag. ? Place a towel between your skin and the bag. ? Leave the ice on for 20 minutes, 2-3 times a day.  Raise (elevate) the affected joint above the level of your heart as often as possible.  Rest the joint as much as possible. If the affected joint is in your leg, you may be given crutches to use.  Follow instructions from your health care provider about eating or drinking restrictions. Avoiding future gout attacks  Follow a low-purine diet as told by your dietitian or health care provider. Avoid foods and drinks that are high in purines, including liver, kidney, anchovies, asparagus, herring, mushrooms, mussels, and beer.  Maintain a healthy weight or lose weight if you are overweight. If you want to lose weight, talk with your health care provider. It is important that you do not lose weight too quickly.  Start or maintain an exercise program as told by your health care provider. Eating and drinking  Drink enough fluids to keep your urine pale yellow.  If you drink alcohol: ? Limit how much you use to:  0-1 drink a day for women.  0-2 drinks a day for men. ? Be aware of how much alcohol is in your drink. In the [place] one drink equals one 12 oz bottle of beer (355 mL) one 5 oz glass of wine (148 mL), or one 1 oz glass of hard liquor (44 mL). General instructions  Take over-the-counter and prescription medicines only as  told by your health care provider.  Do not drive or use heavy machinery while taking prescription pain medicine.  Return to your normal activities as told by your health care provider. Ask your health care provider what activities are safe for you.  Keep all follow-up visits as told by your health care provider. This is important. Contact a health care provider if you have:  Another gout attack.  Continuing symptoms of a gout   attack after 10 days of treatment.  Side effects from your medicines.  Chills or a fever.  Burning pain when you urinate.  Pain in your lower back or belly. Get help right away if you:  Have severe or uncontrolled pain.  Cannot urinate. Summary  Gout is painful swelling of the joints caused by inflammation.  The most common site of pain is the big toe, but it can affect other joints in the body.  Medicines and dietary changes can help to prevent and treat gout attacks. This information is not intended to replace advice given to you by your health care provider. Make sure you discuss any questions you have with your health care provider. Document Released: 02/03/2000 Document Revised: 08/28/2017 Document Reviewed: 08/28/2017 Elsevier Patient Education  2020 Elsevier Inc.  

## 2018-11-22 LAB — COMPREHENSIVE METABOLIC PANEL
ALT: 13 IU/L (ref 0–44)
AST: 15 IU/L (ref 0–40)
Albumin/Globulin Ratio: 1.6 (ref 1.2–2.2)
Albumin: 4 g/dL (ref 3.8–4.9)
Alkaline Phosphatase: 114 IU/L (ref 39–117)
BUN/Creatinine Ratio: 14 (ref 9–20)
BUN: 11 mg/dL (ref 6–24)
Bilirubin Total: 0.4 mg/dL (ref 0.0–1.2)
CO2: 20 mmol/L (ref 20–29)
Calcium: 9.4 mg/dL (ref 8.7–10.2)
Chloride: 103 mmol/L (ref 96–106)
Creatinine, Ser: 0.79 mg/dL (ref 0.76–1.27)
GFR calc Af Amer: 119 mL/min/{1.73_m2} (ref >59)
GFR calc non Af Amer: 103 mL/min/{1.73_m2} (ref >59)
Globulin, Total: 2.5 g/dL (ref 1.5–4.5)
Glucose: 156 mg/dL — ABNORMAL HIGH (ref 65–99)
Potassium: 4.2 mmol/L (ref 3.5–5.2)
Sodium: 140 mmol/L (ref 134–144)
Total Protein: 6.5 g/dL (ref 6.0–8.5)

## 2018-11-22 LAB — CBC WITH DIFFERENTIAL/PLATELET
Basophils Absolute: 0.1 10*3/uL (ref 0.0–0.2)
Basos: 1 %
EOS (ABSOLUTE): 0.4 10*3/uL (ref 0.0–0.4)
Eos: 5 %
Hematocrit: 41 % (ref 37.5–51.0)
Hemoglobin: 14.2 g/dL (ref 13.0–17.7)
Immature Grans (Abs): 0 10*3/uL (ref 0.0–0.1)
Immature Granulocytes: 0 %
Lymphocytes Absolute: 2 10*3/uL (ref 0.7–3.1)
Lymphs: 22 %
MCH: 30 pg (ref 26.6–33.0)
MCHC: 34.6 g/dL (ref 31.5–35.7)
MCV: 87 fL (ref 79–97)
Monocytes Absolute: 0.7 10*3/uL (ref 0.1–0.9)
Monocytes: 7 %
Neutrophils Absolute: 5.9 10*3/uL (ref 1.4–7.0)
Neutrophils: 65 %
Platelets: 286 10*3/uL (ref 150–450)
RBC: 4.74 x10E6/uL (ref 4.14–5.80)
RDW: 13.2 % (ref 11.6–15.4)
WBC: 9.1 10*3/uL (ref 3.4–10.8)

## 2018-11-22 LAB — LIPID PANEL
Chol/HDL Ratio: 5.3 ratio — ABNORMAL HIGH (ref 0.0–5.0)
Cholesterol, Total: 148 mg/dL (ref 100–199)
HDL: 28 mg/dL — ABNORMAL LOW (ref >39)
LDL Chol Calc (NIH): 97 mg/dL (ref 0–99)
Triglycerides: 127 mg/dL (ref 0–149)
VLDL Cholesterol Cal: 23 mg/dL (ref 5–40)

## 2018-11-22 LAB — HEMOGLOBIN A1C
Est. average glucose Bld gHb Est-mCnc: 137 mg/dL
Hgb A1c MFr Bld: 6.4 % — ABNORMAL HIGH (ref 4.8–5.6)

## 2018-11-22 LAB — URIC ACID: Uric Acid: 7.2 mg/dL (ref 3.7–8.6)

## 2018-11-24 ENCOUNTER — Telehealth: Payer: Self-pay | Admitting: Family Medicine

## 2018-11-24 DIAGNOSIS — M2550 Pain in unspecified joint: Secondary | ICD-10-CM

## 2018-11-24 NOTE — Telephone Encounter (Signed)
Can send prescription for #30, rf x 3

## 2018-11-24 NOTE — Telephone Encounter (Signed)
Reviewed with patient lab results, reviewed by Simona Huh on 10/5. Order has been placed in for rheumatologist since patient has complained of wrist pain, knee pain and difficulty walking. According to [facility] message he said if pain was not improving can send in prescription for allopurinol but there wasn't a qty or amount of refill  in message please advise. KW

## 2018-11-24 NOTE — Telephone Encounter (Signed)
Pt needing results for labs and gout medication that is supposed to be prescribed from visit with Simona Huh.  Please advise.  Thanks, [employer]

## 2018-11-25 MED ORDER — ALLOPURINOL 100 MG PO TABS: 100.0000 mg | ORAL_TABLET | Freq: Every day | ORAL | 3 refills | Status: DC

## 2018-11-25 NOTE — Telephone Encounter (Signed)
RX sent   Thanks,   -Laura  

## 2018-11-28 ENCOUNTER — Other Ambulatory Visit: Payer: Self-pay | Admitting: Family Medicine

## 2018-11-28 ENCOUNTER — Telehealth: Payer: Self-pay

## 2018-11-28 DIAGNOSIS — M1A09X1 Idiopathic chronic gout, multiple sites, with tophus (tophi): Secondary | ICD-10-CM

## 2018-11-28 MED ORDER — PREDNISONE 10 MG PO TABS: ORAL_TABLET | ORAL | 0 refills | Status: DC

## 2018-11-28 NOTE — Telephone Encounter (Signed)
Will send another taper to the pharmacy and consider tests and x-ray for ruling out rheumatoid arthritis, also.

## 2018-11-28 NOTE — Telephone Encounter (Signed)
Patient notified

## 2018-11-28 NOTE — Telephone Encounter (Signed)
Patient was put on Prednisone and Allopurinol for gout.  While on the Prednisone the wrist started to improve.  Since finishing the steroids he woke today and he can not bend his wrist.   CB # [place]

## 2018-12-22 DIAGNOSIS — M24522 Contracture, left elbow: Secondary | ICD-10-CM | POA: Insufficient documentation

## 2018-12-29 ENCOUNTER — Encounter: Payer: Self-pay | Admitting: Family Medicine

## 2018-12-29 DIAGNOSIS — M1A9XX1 Chronic gout, unspecified, with tophus (tophi): Secondary | ICD-10-CM | POA: Insufficient documentation

## 2019-02-16 ENCOUNTER — Other Ambulatory Visit: Payer: Self-pay | Admitting: Family Medicine

## 2019-02-16 DIAGNOSIS — I10 Essential (primary) hypertension: Secondary | ICD-10-CM

## 2019-03-31 ENCOUNTER — Other Ambulatory Visit: Payer: Self-pay

## 2019-03-31 ENCOUNTER — Encounter: Payer: Self-pay | Admitting: *Deleted

## 2019-03-31 DIAGNOSIS — I10 Essential (primary) hypertension: Secondary | ICD-10-CM

## 2019-03-31 NOTE — Telephone Encounter (Signed)
LMTCB, need to know which medications he needs refills on and schedule a follow up with Dr. Sherrie Mustache in March. Okay for PEC to advise patient.

## 2019-03-31 NOTE — Telephone Encounter (Signed)
Attempted to contact pt; left message on voicemail. 

## 2019-03-31 NOTE — Addendum Note (Signed)
Addended by: Sherre Poot on: 03/31/2019 03:18 PM   Modules accepted: Orders

## 2019-03-31 NOTE — Telephone Encounter (Signed)
Which medication does he need refilled.... we could probably refill it to get by until I can see him. He can see a PA otherwise

## 2019-03-31 NOTE — Telephone Encounter (Addendum)
Pt will need lisinopril-hydrochlorothiazide (ZESTORETIC) 10-12.5 MG tablet [[id number]] furosemide (LASIX) 20 MG tablet [[id number]] allopurinol (ZYLOPRIM) 100 MG tablet [[id number]]   Pt is scheduled for 04/24/2019 for follow up

## 2019-03-31 NOTE — Addendum Note (Signed)
Addended by: Malva Limes on: 03/31/2019 02:05 PM   Modules accepted: Orders

## 2019-03-31 NOTE — Addendum Note (Signed)
Addended by: Stevphen Meuse on: 03/31/2019 03:27 PM   Modules accepted: Orders

## 2019-03-31 NOTE — Telephone Encounter (Signed)
Copied from CRM [id number]. Topic: Appointment Scheduling - Scheduling Inquiry for Clinic >> Mar 31, 2019 12:47 PM Fanny Bien wrote: Reason for CRM: pt fiance called and would like to know if we can get the pt in with another provider to refill medication. He can only come in this week and Dr Sherrie Mustache has no appointments available. Please advise

## 2019-03-31 NOTE — Telephone Encounter (Signed)
This encounter was created in error - please disregard.

## 2019-04-01 ENCOUNTER — Other Ambulatory Visit: Payer: Self-pay | Admitting: Family Medicine

## 2019-04-01 MED ORDER — FUROSEMIDE 20 MG PO TABS: 20.0000 mg | ORAL_TABLET | Freq: Every day | ORAL | 0 refills | Status: DC

## 2019-04-01 MED ORDER — ALLOPURINOL 100 MG PO TABS: 100.0000 mg | ORAL_TABLET | Freq: Every day | ORAL | 1 refills | Status: DC

## 2019-04-01 MED ORDER — LISINOPRIL-HYDROCHLOROTHIAZIDE 10-12.5 MG PO TABS: 1.0000 | ORAL_TABLET | Freq: Every day | ORAL | 0 refills | Status: DC

## 2019-04-01 NOTE — Addendum Note (Signed)
Addended by: Malva Limes on: 04/01/2019 08:14 AM   Modules accepted: Orders

## 2019-04-01 NOTE — Telephone Encounter (Signed)
Appointment 3/5- refilled per protocol

## 2019-04-09 ENCOUNTER — Telehealth: Payer: Self-pay

## 2019-04-09 DIAGNOSIS — Z125 Encounter for screening for malignant neoplasm of prostate: Secondary | ICD-10-CM

## 2019-04-09 DIAGNOSIS — K760 Fatty (change of) liver, not elsewhere classified: Secondary | ICD-10-CM

## 2019-04-09 DIAGNOSIS — E781 Pure hyperglyceridemia: Secondary | ICD-10-CM

## 2019-04-09 DIAGNOSIS — I10 Essential (primary) hypertension: Secondary | ICD-10-CM

## 2019-04-09 DIAGNOSIS — E119 Type 2 diabetes mellitus without complications: Secondary | ICD-10-CM

## 2019-04-09 NOTE — Telephone Encounter (Signed)
Copied from CRM [id number]. Topic: General - Other >> Apr 09, 2019  3:24 PM Gwenlyn Fudge wrote: Reason for CRM: Pts wife called and is requesting to have pts lab orders placed so he can have it done before his appt on 05/22/19. Please advise.

## 2019-04-13 NOTE — Telephone Encounter (Signed)
Future order placed, he can go to lab up 7-10 days before his appt. Needs to be fasting.

## 2019-04-13 NOTE — Addendum Note (Signed)
Addended by: Malva Limes on: 04/13/2019 05:14 PM   Modules accepted: Orders

## 2019-04-14 NOTE — Telephone Encounter (Signed)
Patient's wife advised

## 2019-04-24 ENCOUNTER — Ambulatory Visit: Payer: Self-pay | Admitting: Family Medicine

## 2019-05-22 ENCOUNTER — Other Ambulatory Visit: Payer: Self-pay

## 2019-05-22 ENCOUNTER — Ambulatory Visit (INDEPENDENT_AMBULATORY_CARE_PROVIDER_SITE_OTHER): Payer: Self-pay | Admitting: Family Medicine

## 2019-05-22 ENCOUNTER — Encounter: Payer: Self-pay | Admitting: Family Medicine

## 2019-05-22 VITALS — BP 131/79 | HR 76 | Temp 96.9°F | Resp 16 | Wt 219.0 lb

## 2019-05-22 DIAGNOSIS — Z125 Encounter for screening for malignant neoplasm of prostate: Secondary | ICD-10-CM

## 2019-05-22 DIAGNOSIS — E119 Type 2 diabetes mellitus without complications: Secondary | ICD-10-CM

## 2019-05-22 DIAGNOSIS — E781 Pure hyperglyceridemia: Secondary | ICD-10-CM

## 2019-05-22 DIAGNOSIS — I10 Essential (primary) hypertension: Secondary | ICD-10-CM

## 2019-05-22 DIAGNOSIS — K760 Fatty (change of) liver, not elsewhere classified: Secondary | ICD-10-CM

## 2019-05-22 LAB — POCT GLYCOSYLATED HEMOGLOBIN (HGB A1C)
Est. average glucose Bld gHb Est-mCnc: 137
Hemoglobin A1C: 6.4 % — AB (ref 4.0–5.6)

## 2019-05-22 MED ORDER — FUROSEMIDE 20 MG PO TABS: 20.0000 mg | ORAL_TABLET | Freq: Every day | ORAL | 3 refills | Status: DC

## 2019-05-22 MED ORDER — LISINOPRIL-HYDROCHLOROTHIAZIDE 10-12.5 MG PO TABS: 1.0000 | ORAL_TABLET | Freq: Every day | ORAL | 2 refills | Status: DC

## 2019-05-22 NOTE — Progress Notes (Signed)
Established patient visit      Patient: Howard Sanders   DOB: [birth year]   54 y.o. Male  MRN: [id number] Visit Date: 05/22/2019  Today's healthcare provider: Lelon Huh, MD  Subjective:    Chief Complaint  Patient presents with  . Follow-up  . Diabetes  . Hypertension  . Hyperlipidemia   HPI  Diabetes Mellitus Type II, Follow-up:   Lab Results  Component Value Date   HGBA1C 6.4 (A) 05/22/2019   HGBA1C 6.4 (H) 11/21/2018   HGBA1C 6.9 (A) 10/07/2017   Last seen for diabetes 6 months ago.  Management since then includes; labs checked showing-Glucose (156) is high but Hgb A1C (6.4) is in very good shape. He reports fair compliance with treatment. He is not having side effects.  Current symptoms include none  Home blood sugar records: fasting range: 120  Episodes of hypoglycemia? no   Current Insulin Regimen: N/A Most Recent Eye Exam: not UTD Weight trend: stable Prior visit with dietician: no Current diet: well balanced Current exercise: none  ----------------------------------------------------------    Hypertension, follow-up:  BP Readings from Last 3 Encounters:  05/22/19 131/79  11/21/18 (!) 178/102  10/07/17 123/80    He was last seen for hypertension 6 months ago.  BP at that visit was 178/102. Management since that visit includes;Recommended he take the Lisinopril/HCTZ daily and if BP goes too low, change to only the Lisinopril 10 mg qd.  .He reports fair compliance with treatment. He is not having side effects.  He is not exercising. He is adherent to low salt diet.   Outside blood pressures are normal. He is experiencing none.  Patient denies chest pain, chest pressure/discomfort, irregular heart beat and palpitations.   Cardiovascular risk factors include diabetes mellitus, hypertension and male gender.  Use of agents associated with hypertension: none.    -----------------------------------------------------------    Lipid/Cholesterol, Follow-up:   Last seen for this 6 months ago.  Management since that visit includes; labs checked, no changes.  Last Lipid Panel:    Component Value Date/Time   CHOL 148 11/21/2018 1031   TRIG 127 11/21/2018 1031   HDL 28 (L) 11/21/2018 1031   CHOLHDL 5.3 (H) 11/21/2018 1031   LDLCALC 97 11/21/2018 1031    He reports fair compliance with treatment. He is not having side effects.   Wt Readings from Last 3 Encounters:  05/22/19 219 lb (99.3 kg)  11/21/18 226 lb (102.5 kg)  10/07/17 233 lb (105.7 kg)    ---------------------------------------------------------- Since his last visit he has been followed by Dr. Barb Merino for generalized arthralgias and is now taking 300mg  allopurinol and colchicine daily for gout. He states his joint aches do not bother him so long as he takes medications everyday. He has been on high protein diet due to gaining weight and having fatty liver, which he states has helped tremendously but does make it more difficult to control gout.       Medications: Outpatient Medications Prior to Visit  Medication Sig  . allopurinol (ZYLOPRIM) 100 MG tablet TAKE 1 TABLET BY MOUTH EVERY DAY (Patient taking differently: 3 (three) times daily. )  . colchicine 0.6 MG tablet Take one tab along with allopurinol for  90  days  . furosemide (LASIX) 20 MG tablet Take 1 tablet (20 mg total) by mouth daily. For swelling  . lisinopril-hydrochlorothiazide (ZESTORETIC) 10-12.5 MG tablet Take 1 tablet by mouth daily.  Marland Kitchen lovastatin (MEVACOR) 40 MG tablet TAKE 1 TABLET (40 MG TOTAL) BY  MOUTH AT BEDTIME. (Patient not taking: Reported on 10/07/2017)   No facility-administered medications prior to visit.    Review of Systems  Constitutional: Negative for appetite change, chills and fever.  Respiratory: Negative for chest tightness, shortness of breath and wheezing.   Cardiovascular: Negative for  chest pain and palpitations.  Gastrointestinal: Negative for abdominal pain, nausea and vomiting.  Genitourinary: Urgency: .diagm.       Objective:    BP 131/79 (BP Location: Left Arm, Patient Position: Sitting, Cuff Size: Large)   Pulse 76   Temp (!) 96.9 F (36.1 C) (Other (Comment))   Resp 16   Wt 219 lb (99.3 kg)   SpO2 99%   BMI 30.54 kg/m    Physical Exam  General appearance: Obese male, cooperative and in no acute distress Head: Normocephalic, without obvious abnormality, atraumatic Respiratory: Respirations even and unlabored, normal respiratory rate Extremities: All extremities are intact.  Skin: Skin color, texture, turgor normal. No rashes seen  Psych: Appropriate mood and affect. Neurologic: Mental status: Alert, oriented to person, place, and time, thought content appropriate.   Results for orders placed or performed in visit on 05/22/19  POCT glycosylated hemoglobin (Hb A1C)  Result Value Ref Range   Hemoglobin A1C 6.4 (A) 4.0 - 5.6 %   Est. average glucose Bld gHb Est-mCnc 137       Assessment & Plan:    1. Type 2 diabetes mellitus without complication, without long-term current use of insulin (HCC) Dong very with low car/atkins type diet not requiring any medications.  - Lipid panel - CBC  2. Essential hypertension Well controlled.  Continue current medications.   - lisinopril-hydrochlorothiazide (ZESTORETIC) 10-12.5 MG tablet; Take 1 tablet by mouth daily.  Dispense: 90 tablet; Refill: 2  3. Fatty liver Recent transaminases at [facility] were normal.   4. Hypertriglyceridemia Now off of lovastatin which he thought was contributing to liver problems.  - Lipid panel - CBC   - furosemide (LASIX) 20 MG tablet; Take 1 tablet (20 mg total) by mouth daily. For swelling  Dispense: 90 tablet; Refill: 3 Doing very well on low carb   Follow up to check a1c in 6 months.    The entirety of the information documented in the History of Present Illness, Review of  Systems and Physical Exam were personally obtained by me. Portions of this information were initially documented by April M. Hyacinth Meeker, CMA and reviewed by me for thoroughness and accuracy.    Mila Merry, MD  [facility] [phone] (phone) [phone] (fax)  [facility]

## 2019-05-22 NOTE — Patient Instructions (Signed)
.   Please review the attached list of medications and notify my office if there are any errors.   . Please contact your eyecare professional to schedule a routine eye exam  

## 2019-05-26 ENCOUNTER — Telehealth: Payer: Self-pay

## 2019-05-26 LAB — CBC
Hematocrit: 45.8 % (ref 37.5–51.0)
Hemoglobin: 15.2 g/dL (ref 13.0–17.7)
MCH: 30.2 pg (ref 26.6–33.0)
MCHC: 33.2 g/dL (ref 31.5–35.7)
MCV: 91 fL (ref 79–97)
Platelets: 251 10*3/uL (ref 150–450)
RBC: 5.04 x10E6/uL (ref 4.14–5.80)
RDW: 14 % (ref 11.6–15.4)
WBC: 10.6 10*3/uL (ref 3.4–10.8)

## 2019-05-26 LAB — LIPID PANEL
Chol/HDL Ratio: 7.8 ratio — ABNORMAL HIGH (ref 0.0–5.0)
Cholesterol, Total: 186 mg/dL (ref 100–199)
HDL: 24 mg/dL — ABNORMAL LOW (ref >39)
LDL Chol Calc (NIH): 100 mg/dL — ABNORMAL HIGH (ref 0–99)
Triglycerides: 364 mg/dL — ABNORMAL HIGH (ref 0–149)
VLDL Cholesterol Cal: 62 mg/dL — ABNORMAL HIGH (ref 5–40)

## 2019-05-26 NOTE — Telephone Encounter (Signed)
Tried calling patient. Left message to call back. OK for PEC Triage to advise and then route message back to office.

## 2019-05-26 NOTE — Telephone Encounter (Signed)
-----   Message from Malva Limes, MD sent at 05/26/2019  8:00 AM EDT ----- Triglycerides are back up from 127 to 364. HDL cholesterol is very low at 24, should be over 24. These are high risk for causing heart disease. Need to start back on lovastatin 40mg  once tablet daily, please send in prescription for #30, rf x 11.   Rest of labs are normal. Follow up BP and lipid check in 4-6 months.

## 2019-05-27 NOTE — Telephone Encounter (Signed)
Attempted to call patient with lab results- left message to return call.

## 2019-06-01 NOTE — Telephone Encounter (Signed)
Patient advised. Patient declined Lovastatin at this time. He states he will try lifestyle changes.

## 2019-11-23 ENCOUNTER — Encounter: Payer: Self-pay | Admitting: Family Medicine

## 2019-11-23 ENCOUNTER — Ambulatory Visit (INDEPENDENT_AMBULATORY_CARE_PROVIDER_SITE_OTHER): Payer: Self-pay | Admitting: Family Medicine

## 2019-11-23 ENCOUNTER — Other Ambulatory Visit: Payer: Self-pay

## 2019-11-23 VITALS — BP 118/69 | HR 89 | Temp 98.0°F | Resp 16 | Ht 72.0 in | Wt 224.0 lb

## 2019-11-23 DIAGNOSIS — M1A9XX1 Chronic gout, unspecified, with tophus (tophi): Secondary | ICD-10-CM

## 2019-11-23 DIAGNOSIS — E119 Type 2 diabetes mellitus without complications: Secondary | ICD-10-CM

## 2019-11-23 DIAGNOSIS — I10 Essential (primary) hypertension: Secondary | ICD-10-CM

## 2019-11-23 DIAGNOSIS — E781 Pure hyperglyceridemia: Secondary | ICD-10-CM

## 2019-11-23 MED ORDER — ALLOPURINOL 300 MG PO TABS: 300.0000 mg | ORAL_TABLET | Freq: Every day | ORAL | 3 refills | Status: DC

## 2019-11-23 MED ORDER — LISINOPRIL-HYDROCHLOROTHIAZIDE 10-12.5 MG PO TABS: 1.0000 | ORAL_TABLET | Freq: Every day | ORAL | 2 refills | Status: DC

## 2019-11-23 MED ORDER — COLCHICINE 0.6 MG PO TABS: 0.6000 mg | ORAL_TABLET | Freq: Every day | ORAL | 4 refills | Status: DC | PRN

## 2019-11-23 NOTE — Progress Notes (Signed)
I,Roshena L Chambers,acting as a scribe for Mila Merry, MD.,have documented all relevant documentation on the behalf of Mila Merry, MD,as directed by  Mila Merry, MD while in the presence of Mila Merry, MD.  Established patient visit   Patient: Howard Sanders   DOB: June 03, 1965   54 y.o. Male  MRN: [id number] Visit Date: 11/23/2019  Today's healthcare provider: Mila Merry, MD   Chief Complaint  Patient presents with  . Diabetes  . Hyperlipidemia  . Hypertension   Subjective    HPI  Diabetes Mellitus Type II, Follow-up  Lab Results  Component Value Date   HGBA1C 6.4 (A) 05/22/2019   HGBA1C 6.4 (H) 11/21/2018   HGBA1C 6.9 (A) 10/07/2017   Wt Readings from Last 3 Encounters:  11/23/19 224 lb (101.6 kg)  05/22/19 219 lb (99.3 kg)  11/21/18 226 lb (102.5 kg)   Last seen for diabetes 6 months ago.  Management since then includes continuing low glycemic index diet. He reports fair compliance with treatment. Patient is on Keto diet. He is not having side effects.  Symptoms: No fatigue No foot ulcerations  No appetite changes No nausea  No paresthesia of the feet  No polydipsia  No polyuria No visual disturbances   No vomiting     Home blood sugar records: fasting range: 120's  Episodes of hypoglycemia? No     Current insulin regiment: none Most Recent Eye Exam: 1 week ago Current exercise: none Current diet habits: Keto diet  Pertinent Labs: Lab Results  Component Value Date   CHOL 186 05/25/2019   HDL 24 (L) 05/25/2019   LDLCALC 100 (H) 05/25/2019   TRIG 364 (H) 05/25/2019   CHOLHDL 7.8 (H) 05/25/2019   Lab Results  Component Value Date   NA 140 11/21/2018   K 4.2 11/21/2018   CREATININE 0.79 11/21/2018   GFRNONAA 103 11/21/2018   GFRAA 119 11/21/2018   GLUCOSE 156 (H) 11/21/2018     ---------------------------------------------------------------------------------------------------  Lipid/Cholesterol, Follow-up  Last lipid panel  Other pertinent labs  Lab Results  Component Value Date   CHOL 186 05/25/2019   HDL 24 (L) 05/25/2019   LDLCALC 100 (H) 05/25/2019   TRIG 364 (H) 05/25/2019   CHOLHDL 7.8 (H) 05/25/2019   Lab Results  Component Value Date   ALT 13 11/21/2018   AST 15 11/21/2018   PLT 251 05/25/2019     He was last seen for this 6 months ago.  Management since that visit includes restarting Lovastatin 40mg  once tablet daily.  He reports poor compliance with treatment. Patient did not start Lovastatin. He is not having side effects.   Symptoms: No chest pain No chest pressure/discomfort  No dyspnea No lower extremity edema  No numbness or tingling of extremity No orthopnea  No palpitations No paroxysmal nocturnal dyspnea  No speech difficulty No syncope   Current diet: Keto Diet Current exercise: none  The 10-year ASCVD risk score DC Jr., et al., 2013) is: 29.6%  ---------------------------------------------------------------------------------------------------  Hypertension, follow-up  BP Readings from Last 3 Encounters:  11/23/19 118/69  05/22/19 131/79  11/21/18 (!) 178/102   Wt Readings from Last 3 Encounters:  11/23/19 224 lb (101.6 kg)  05/22/19 219 lb (99.3 kg)  11/21/18 226 lb (102.5 kg)     He was last seen for hypertension 6 months ago.  BP at that visit was 131/79. Management since that visit includes continuing same medication.  He reports good compliance with treatment. He is not  having side effects.  He is following a Regular diet. He is not exercising. He does smoke.  Use of agents associated with hypertension: none.   Outside blood pressures are averaging 117/78. Symptoms: No chest pain No chest pressure  No palpitations No syncope  No dyspnea No orthopnea  No paroxysmal nocturnal dyspnea Yes lower extremity edema   Pertinent labs: Lab Results  Component Value Date   CHOL 186 05/25/2019   HDL 24 (L) 05/25/2019   LDLCALC 100 (H) 05/25/2019    TRIG 364 (H) 05/25/2019   CHOLHDL 7.8 (H) 05/25/2019   Lab Results  Component Value Date   NA 140 11/21/2018   K 4.2 11/21/2018   CREATININE 0.79 11/21/2018   GFRNONAA 103 11/21/2018   GFRAA 119 11/21/2018   GLUCOSE 156 (H) 11/21/2018     The 10-year ASCVD risk score Denman George DC Jr., et al., 2013) is: 29.6%   ---------------------------------------------------------------------------------------------------     Medications: Outpatient Medications Prior to Visit  Medication Sig  . allopurinol (ZYLOPRIM) 100 MG tablet TAKE 1 TABLET BY MOUTH EVERY DAY (Patient taking differently: 3 (three) times daily. )  . colchicine 0.6 MG tablet Take one tab along with allopurinol for  90  days  . furosemide (LASIX) 20 MG tablet Take 1 tablet (20 mg total) by mouth daily. For swelling  . lisinopril-hydrochlorothiazide (ZESTORETIC) 10-12.5 MG tablet Take 1 tablet by mouth daily.   No facility-administered medications prior to visit.    Review of Systems  Constitutional: Negative for appetite change, chills and fever.  Respiratory: Negative for chest tightness, shortness of breath and wheezing.   Cardiovascular: Positive for leg swelling. Negative for chest pain and palpitations.  Gastrointestinal: Negative for abdominal pain, nausea and vomiting.      Objective    BP 118/69 (BP Location: Left Arm)   Pulse 89   Temp 98 F (36.7 C) (Oral)   Resp 16   Ht 6' (1.829 m)   Wt 224 lb (101.6 kg)   BMI 30.38 kg/m    Physical Exam  General appearance: Mildly obese male, cooperative and in no acute distress Head: Normocephalic, without obvious abnormality, atraumatic Respiratory: Respirations even and unlabored, normal respiratory rate Extremities: All extremities are intact.  Skin: Skin color, texture, turgor normal. No rashes seen  Psych: Appropriate mood and affect. Neurologic: Mental status: Alert, oriented to person, place, and time, thought content appropriate.   No results found for  any visits on 11/23/19.  Assessment & Plan     1. Essential hypertension Well controlled.  Continue current medications.   - lisinopril-hydrochlorothiazide (ZESTORETIC) 10-12.5 MG tablet; Take 1 tablet by mouth daily.  Dispense: 90 tablet; Refill: 2  Encouraged regular exercise.   2. Type 2 diabetes mellitus without complication, without long-term current use of insulin (HCC)  - Hemoglobin A1c  3. Hypertriglyceridemia Has been working on improving diet.  - Lipid panel - TSH  4. Chronic tophaceous gout Well controlled on current regiment. refilled- allopurinol (ZYLOPRIM) 300 MG tablet; Take 1 tablet (300 mg total) by mouth daily.  Dispense: 90 tablet; Refill: 3 and- colchicine 0.6 MG tablet; Take 1 tablet (0.6 mg total) by mouth daily as needed (gout).  Dispense: 90 tablet; Refill: 4        The entirety of the information documented in the History of Present Illness, Review of Systems and Physical Exam were personally obtained by me. Portions of this information were initially documented by the CMA and reviewed by me for thoroughness and accuracy.  Lelon Huh, MD  [facility] [phone] (phone) [phone] (fax)  [place]

## 2019-11-23 NOTE — Patient Instructions (Addendum)
.   Please review the attached list of medications and notify my office if there are any errors.   The CDC recommends two doses of Shingrix (the shingles vaccine) separated by 2 to 6 months for adults age 53 years and older. I recommend checking with your pharmacy plan regarding coverage for this vaccine.   . Please contact your eyecare professional to schedule a routine eye exam. I recommend seeing Dr. Edger House at [phone] or the [facility] at [phone]    Please stop smoking  . Covid-19 vaccines: The Covid vaccines have been given to hundreds of millions of people and found to be very effective and are as safe as any other vaccine.  The [employer] vaccine has been associated with very rare dangerous blood clots, but only in adult women under the age of 76.  The risk of dying from Covid infections is much higher than having a serious reaction to the vaccine.  I strongly recommend getting fully vaccinated against Covid-19.  I recommend that adult women under 60 get fully vaccinated, but the [place] and [employer] vaccines may be safer for those women than the [employer] vaccine.   . Please go to the lab draw station in [address] on the second floor of [facility]  when you are fasting for 8 hours. Normal hours are 8:00am to 12:30pm and 1:30pm to 4:00pm Monday through Friday

## 2019-12-03 ENCOUNTER — Telehealth: Payer: Self-pay

## 2019-12-03 LAB — LIPID PANEL
Chol/HDL Ratio: 7.8 ratio — ABNORMAL HIGH (ref 0.0–5.0)
Cholesterol, Total: 187 mg/dL (ref 100–199)
HDL: 24 mg/dL — ABNORMAL LOW (ref >39)
LDL Chol Calc (NIH): 121 mg/dL — ABNORMAL HIGH (ref 0–99)
Triglycerides: 235 mg/dL — ABNORMAL HIGH (ref 0–149)
VLDL Cholesterol Cal: 42 mg/dL — ABNORMAL HIGH (ref 5–40)

## 2019-12-03 LAB — HEMOGLOBIN A1C
Est. average glucose Bld gHb Est-mCnc: 157 mg/dL
Hgb A1c MFr Bld: 7.1 % — ABNORMAL HIGH (ref 4.8–5.6)

## 2019-12-03 LAB — TSH: TSH: 1.33 u[IU]/mL (ref 0.450–4.500)

## 2019-12-03 NOTE — Telephone Encounter (Signed)
Copied from CRM [id number]. Topic: General - Other >> Dec 03, 2019  3:17 PM Jaquita Rector A wrote: Reason for CRM: Patient would like a call back from dr Sherrie Mustache or nurse about lab results from 12/02/19 Please call and results can be discussed with wife per patient Ph# [phone]

## 2019-12-04 ENCOUNTER — Telehealth: Payer: Self-pay

## 2019-12-04 NOTE — Telephone Encounter (Signed)
Attempted to contact patient, no answer left a voicemail. Okay for PEC triage to advise patient. ° °

## 2019-12-04 NOTE — Telephone Encounter (Signed)
-----   Message from Malva Limes, MD sent at 12/03/2019  7:09 AM EDT ----- A1c is up to 7.1. triglycereides are high at 235, should be under 150.  Need to start metformin ER 500mg  once a day for diabetes, #30, rf x 3 And atorvastatin 20mg  one tablet daily, #30, rf x 3 Need to schedule follow up for diabetes and hypertriglyceridemia in 3 months.

## 2019-12-04 NOTE — Telephone Encounter (Signed)
Attempted to contact patient, no answer left a voicemail. Okay for PEC to advise patient.  

## 2019-12-04 NOTE — Telephone Encounter (Signed)
Pts wife called about lab results/ please advise

## 2019-12-07 ENCOUNTER — Ambulatory Visit: Payer: Self-pay | Admitting: *Deleted

## 2019-12-07 MED ORDER — ATORVASTATIN CALCIUM 20 MG PO TABS: 20.0000 mg | ORAL_TABLET | Freq: Every day | ORAL | 3 refills | Status: DC

## 2019-12-07 NOTE — Telephone Encounter (Signed)
It looks like he had an ER visit in 2018 after metformin dose was increased to 1000mg  twice a day. Let's just hold off on starting any new medication for sugar and just start the cholesterol medication first. If his A1c stays up, we'll try something besides the metformin. Have sent prescription for atorvastatin, please schedule follow up in 3 months.

## 2019-12-07 NOTE — Telephone Encounter (Signed)
Patient returned call and NT reviewed message from Dr.Fisher on 12/07/19. Patient verbalized understanding to start atorvastatin and hold off on starting metformin. appt made for 03/09/2020 at 1040.

## 2019-12-07 NOTE — Addendum Note (Signed)
Addended by: Malva Limes on: 12/07/2019 04:34 PM   Modules accepted: Orders

## 2019-12-07 NOTE — Telephone Encounter (Signed)
Attempted to contact patient, no answer left a voicemail. Okay for PEC triage to advise patient. ° °

## 2019-12-07 NOTE — Telephone Encounter (Signed)
Pt given lab results per notes of Dr. Sherrie Mustache on 12/07/19. Pt verbalized understanding. Patient reports concern regarding metformin prescription due to 3 years ago had adverse reaction to "something" and felt metformin may have caused reaction and patient was hospitalized. Patient requesting to speak with PCP regarding medication for diabetes management prior to prescribing. Patient due to physical DOT and requesting call back from PCP as soon as possible before starting new medication. Patient is agreeable with starting atorvastatin. Please advise .

## 2019-12-08 NOTE — Telephone Encounter (Signed)
Patient was advised by [facility] triage nurse in separate message from 12/07/2019. See phone message for recommendation changes.

## 2019-12-31 ENCOUNTER — Telehealth: Payer: Self-pay

## 2019-12-31 NOTE — Telephone Encounter (Signed)
Copied from CRM [id number]. Topic: Quick Communication - See Telephone Encounter >> Dec 31, 2019 11:39 AM Aretta Nip wrote: CRM for notification. See Telephone encounter for: 12/31/19.   Pt dr. has some concerns re pt and is faxing over notes to Dr Sherrie Mustache will be coming in From Dr Dava Najjar. Please route asap to [employer]

## 2020-01-04 ENCOUNTER — Other Ambulatory Visit: Payer: Self-pay | Admitting: Family Medicine

## 2020-01-04 DIAGNOSIS — H35031 Hypertensive retinopathy, right eye: Secondary | ICD-10-CM

## 2020-01-04 NOTE — Progress Notes (Signed)
Fax from [facility] pt has unilateral hypertensive retinopathy OD requesting carotid dopplar ultrasound to rule out unilateral carotid artery disease

## 2020-01-06 ENCOUNTER — Telehealth: Payer: Self-pay

## 2020-01-06 NOTE — Telephone Encounter (Signed)
Copied from CRM [id number]. Topic: General - Other >> Jan 06, 2020 11:47 AM Jaquita Rector A wrote: Reason for CRM: Patient called to inform Dr Sherrie Mustache that he has been having some upper abdominal pain since he started taking the  atorvastatin (LIPITOR) 20 MG tablet per patient he would like to go back on the other cholesterol medication that he was taking previously. Please call Ph# [phone]

## 2020-01-06 NOTE — Telephone Encounter (Signed)
Please review. Thanks!  

## 2020-01-07 MED ORDER — LOVASTATIN 40 MG PO TABS
ORAL_TABLET | ORAL | 3 refills | Status: DC
Start: 2020-01-07 — End: 2020-06-22

## 2020-01-07 NOTE — Telephone Encounter (Signed)
Will address at February appt.

## 2020-01-07 NOTE — Telephone Encounter (Signed)
OK, [facility] Nee to recheck lipids in 3 months.

## 2020-01-07 NOTE — Addendum Note (Signed)
Addended by: Malva Limes on: 01/07/2020 08:35 AM   Modules accepted: Orders

## 2020-01-07 NOTE — Telephone Encounter (Signed)
Tried calling patient. Left detailed message on voice message system (ok per DPR). Patient has an appointment scheduled for 03/09/2020 for f/u DM and cholesterol. Do your want to push this appointment out into February?

## 2020-02-17 ENCOUNTER — Other Ambulatory Visit: Payer: Self-pay

## 2020-02-17 ENCOUNTER — Emergency Department: Payer: No Typology Code available for payment source

## 2020-02-17 ENCOUNTER — Encounter: Payer: Self-pay | Admitting: Emergency Medicine

## 2020-02-17 ENCOUNTER — Emergency Department
Admission: EM | Admit: 2020-02-17 | Discharge: 2020-02-17 | Disposition: A | Discharge status: Home / Self Care | Payer: No Typology Code available for payment source | Attending: Emergency Medicine | Admitting: Emergency Medicine

## 2020-02-17 DIAGNOSIS — E119 Type 2 diabetes mellitus without complications: Secondary | ICD-10-CM | POA: Diagnosis not present

## 2020-02-17 DIAGNOSIS — W1789XA Other fall from one level to another, initial encounter: Secondary | ICD-10-CM | POA: Diagnosis not present

## 2020-02-17 DIAGNOSIS — F17210 Nicotine dependence, cigarettes, uncomplicated: Secondary | ICD-10-CM | POA: Insufficient documentation

## 2020-02-17 DIAGNOSIS — Z79899 Other long term (current) drug therapy: Secondary | ICD-10-CM | POA: Insufficient documentation

## 2020-02-17 DIAGNOSIS — R109 Unspecified abdominal pain: Secondary | ICD-10-CM | POA: Diagnosis present

## 2020-02-17 DIAGNOSIS — N289 Disorder of kidney and ureter, unspecified: Secondary | ICD-10-CM | POA: Insufficient documentation

## 2020-02-17 DIAGNOSIS — W19XXXA Unspecified fall, initial encounter: Secondary | ICD-10-CM

## 2020-02-17 DIAGNOSIS — K439 Ventral hernia without obstruction or gangrene: Secondary | ICD-10-CM | POA: Diagnosis not present

## 2020-02-17 DIAGNOSIS — I10 Essential (primary) hypertension: Secondary | ICD-10-CM | POA: Diagnosis not present

## 2020-02-17 HISTORY — DX: Pure hypercholesterolemia, unspecified: E78.00

## 2020-02-17 LAB — CBC WITH DIFFERENTIAL/PLATELET
Abs Immature Granulocytes: 0.05 10*3/uL (ref 0.00–0.07)
Basophils Absolute: 0 10*3/uL (ref 0.0–0.1)
Basophils Relative: 0 %
Eosinophils Absolute: 0.1 10*3/uL (ref 0.0–0.5)
Eosinophils Relative: 1 %
HCT: 45.1 % (ref 39.0–52.0)
Hemoglobin: 15.9 g/dL (ref 13.0–17.0)
Immature Granulocytes: 0 %
Lymphocytes Relative: 15 %
Lymphs Abs: 2 10*3/uL (ref 0.7–4.0)
MCH: 31 pg (ref 26.0–34.0)
MCHC: 35.3 g/dL (ref 30.0–36.0)
MCV: 87.9 fL (ref 80.0–100.0)
Monocytes Absolute: 0.5 10*3/uL (ref 0.1–1.0)
Monocytes Relative: 4 %
Neutro Abs: 10.7 10*3/uL — ABNORMAL HIGH (ref 1.7–7.7)
Neutrophils Relative %: 80 %
Platelets: 249 10*3/uL (ref 150–400)
RBC: 5.13 MIL/uL (ref 4.22–5.81)
RDW: 13.2 % (ref 11.5–15.5)
WBC: 13.3 10*3/uL — ABNORMAL HIGH (ref 4.0–10.5)
nRBC: 0 % (ref 0.0–0.2)

## 2020-02-17 LAB — COMPREHENSIVE METABOLIC PANEL
ALT: 20 U/L (ref 0–44)
AST: 18 U/L (ref 15–41)
Albumin: 4 g/dL (ref 3.5–5.0)
Alkaline Phosphatase: 107 U/L (ref 38–126)
Anion gap: 10 (ref 5–15)
BUN: 18 mg/dL (ref 6–20)
CO2: 27 mmol/L (ref 22–32)
Calcium: 9.3 mg/dL (ref 8.9–10.3)
Chloride: 102 mmol/L (ref 98–111)
Creatinine, Ser: 0.94 mg/dL (ref 0.61–1.24)
GFR, Estimated: >60 mL/min (ref >60)
Glucose, Bld: 186 mg/dL — ABNORMAL HIGH (ref 70–99)
Potassium: 3.8 mmol/L (ref 3.5–5.1)
Sodium: 139 mmol/L (ref 135–145)
Total Bilirubin: 0.8 mg/dL (ref 0.3–1.2)
Total Protein: 7.5 g/dL (ref 6.5–8.1)

## 2020-02-17 MED ORDER — IOHEXOL 300 MG/ML  SOLN
100.0000 mL | Freq: Once | INTRAMUSCULAR | Status: AC | PRN
  Administered 2020-02-17: 14:00:00 100 mL via INTRAVENOUS
  Filled 2020-02-17: qty 100

## 2020-02-17 NOTE — Discharge Instructions (Addendum)
Follow-up with your regular doctor as needed Follow-up with urology to discuss the renal mass Follow-up with general surgery to discuss the ventral hernia Return emergency department course

## 2020-02-17 NOTE — ED Provider Notes (Signed)
[facility] Emergency Department Provider Note  ____________________________________________   Event Date/Time   First MD Initiated Contact with Patient 02/17/20 1235     (approximate)  I have reviewed the triage vital signs and the nursing notes.   HISTORY  Chief Complaint Abdominal Pain    HPI Howard Sanders is a 54 y.o. male presents emergency department complaining of abdominal pain that radiates through the abdomen.  Patient states he fell off of his truck about 2 weeks ago and landed on his buttocks.  Status he started to fall he hit his abdomen on the ladder.  Patient has a history of a hernia.  He denies head injury, dizziness, LOC.  He has not noticed any blood in his urine    Past Medical History:  Diagnosis Date  . High cholesterol   . Hypertension     Patient Active Problem List   Diagnosis Date Noted  . Chronic tophaceous gout 12/29/2018  . Fatty liver 10/07/2017  . Occult blood in stools 08/09/2016  . Smoking greater than 30 pack years 08/06/2016  . Hypertriglyceridemia 11/03/2014  . Hypertension 11/03/2014  . Edema 11/03/2014  . Diabetes (HCC) 11/03/2014    Past Surgical History:  Procedure Laterality Date  . ELBOW SURGERY Left 1981   Due to fractured done  . NECK SURGERY     swollen lymph nodes removed on each side of neck    Prior to Admission medications   Medication Sig Start Date End Date Taking? Authorizing Provider  allopurinol (ZYLOPRIM) 300 MG tablet Take 1 tablet (300 mg total) by mouth daily. 11/23/19   Malva Limes, MD  colchicine 0.6 MG tablet Take 1 tablet (0.6 mg total) by mouth daily as needed (gout). 11/23/19   Malva Limes, MD  furosemide (LASIX) 20 MG tablet Take 1 tablet (20 mg total) by mouth daily. For swelling 05/22/19   Malva Limes, MD  lisinopril-hydrochlorothiazide (ZESTORETIC) 10-12.5 MG tablet Take 1 tablet by mouth daily. 11/23/19   Malva Limes, MD  lovastatin (MEVACOR) 40 MG  tablet TAKE 1 TABLET (40 MG TOTAL) BY MOUTH AT BEDTIME. 01/07/20   Malva Limes, MD    Allergies Bee venom, Iodine, and Metformin and related  Family History  Problem Relation Age of Onset  . Alcohol abuse Father     Social History Social History   Tobacco Use  . Smoking status: Current Every Day Smoker    Packs/day: 1.50    Years: 24.00    Pack years: 36.00    Types: Cigarettes  . Smokeless tobacco: Former Neurosurgeon  . Tobacco comment: smoked 1 1/2 ppd since age 33  Substance Use Topics  . Alcohol use: No    Review of Systems  Constitutional: No fever/chills Eyes: No visual changes. ENT: No sore throat. Respiratory: Denies cough Cardiovascular: Denies chest pain Gastrointestinal: Positive abdominal pain Genitourinary: Negative for dysuria. Musculoskeletal: Negative for back pain. Skin: Negative for rash. Psychiatric: no mood changes,     ____________________________________________   PHYSICAL EXAM:  VITAL SIGNS: ED Triage Vitals  Enc Vitals Group     BP 02/17/20 1247 (!) 167/102     Pulse Rate 02/17/20 1247 69     Resp 02/17/20 1247 16     Temp 02/17/20 1247 97.6 F (36.4 C)     Temp Source 02/17/20 1247 Oral     SpO2 02/17/20 1247 96 %     Weight 02/17/20 1245 223 lb 15.8 oz (101.6 kg)  Height 02/17/20 1245 6' (1.829 m)     Head Circumference --      Peak Flow --      Pain Score 02/17/20 1245 5     Pain Loc --      Pain Edu? --      Excl. in GC? --     Constitutional: Alert and oriented. Well appearing and in no acute distress. Eyes: Conjunctivae are normal.  Head: Atraumatic. Nose: No congestion/rhinnorhea. Mouth/Throat: Mucous membranes are moist.  Neck:  supple no lymphadenopathy noted Cardiovascular: Normal rate, regular rhythm. Heart sounds are normal Respiratory: Normal respiratory effort.  No retractions, lungs c t a  Abd: soft tender in the epigastric area along his ventral hernia, bs normal all 4 quad GU:  deferred Musculoskeletal: FROM all extremities, warm and well perfused Neurologic:  Normal speech and language.  Skin:  Skin is warm, dry and intact. No rash noted. Psychiatric: Mood and affect are normal. Speech and behavior are normal.  ____________________________________________   LABS (all labs ordered are listed, but only abnormal results are displayed)  Labs Reviewed  COMPREHENSIVE METABOLIC PANEL - Abnormal; Notable for the following components:      Result Value   Glucose, Bld 186 (*)    All other components within normal limits  CBC WITH DIFFERENTIAL/PLATELET - Abnormal; Notable for the following components:   WBC 13.3 (*)    Neutro Abs 10.7 (*)    All other components within normal limits  URINALYSIS, COMPLETE (UACMP) WITH MICROSCOPIC   ____________________________________________   ____________________________________________  RADIOLOGY  CT of the abdomen and pelvis with contrast ____________________________________________   PROCEDURES  Procedure(s) performed: No  Procedures    ____________________________________________   INITIAL IMPRESSION / ASSESSMENT AND PLAN / ED COURSE  Pertinent labs & imaging results that were available during my care of the patient were reviewed by me and considered in my medical decision making (see chart for details).   Patient is 54 year old male presents emergency department with abdominal pain.  Patient states no difficulty with urination or bowel movements.  See HPI Physical exam shows patient is tender along the ventral hernia, remainder the exam is unremarkable  DDx: Worsening hernia, entrapped hernia, strangulated hernia, abdominal trauma  CBC has elevated WBC of 13.3, glucose elevated 186  CT abdomen/pelvis with IV contrast shows ventral hernia and possible kidney lesion.  I did discuss these findings with the patient.  I do not feel that his fall from his trailer caused any severe injuries.  He is to follow-up  with urology for the kidney lesion, follow-up with surgery for ventral hernia.  Return emergency department for worsening.  Is discharged stable condition.     Howard Sanders was evaluated in Emergency Department on 02/17/2020 for the symptoms described in the history of present illness. He was evaluated in the context of the global COVID-19 pandemic, which necessitated consideration that the patient might be at risk for infection with the SARS-CoV-2 virus that causes COVID-19. Institutional protocols and algorithms that pertain to the evaluation of patients at risk for COVID-19 are in a state of rapid change based on information released by regulatory bodies including the CDC and federal and state organizations. These policies and algorithms were followed during the patient's care in the ED.    As part of my medical decision making, I reviewed the following data within the electronic MEDICAL RECORD NUMBER Nursing notes reviewed and incorporated, Labs reviewed , Old chart reviewed, Radiograph reviewed , Notes from prior ED  visits and [place] Controlled Substance Database  ____________________________________________   FINAL CLINICAL IMPRESSION(S) / ED DIAGNOSES  Final diagnoses:  Fall  Ventral hernia without obstruction or gangrene  Kidney lesion      NEW MEDICATIONS STARTED DURING THIS VISIT:  Discharge Medication List as of 02/17/2020  3:09 PM       Note:  This document was prepared using Dragon voice recognition software and may include unintentional dictation errors.    Faythe Ghee, PA-C 02/17/20 1650    Minna Antis, MD 02/18/20 1001

## 2020-02-17 NOTE — ED Triage Notes (Signed)
States he had fallen off of flatbed truck about 3 weeks ago.  Also started a new cholesterol medication.  Then started to have stomach and back pain.  States he had changed back to previous medication, but continues to c/o abdominal and back pain.  States when he fell, fall was about 6.5 feet.

## 2020-02-17 NOTE — ED Notes (Signed)
AAOx3.  Skin warm and dry.  NAD 

## 2020-02-23 ENCOUNTER — Ambulatory Visit (INDEPENDENT_AMBULATORY_CARE_PROVIDER_SITE_OTHER): Payer: Worker's Compensation | Admitting: General Surgery

## 2020-02-23 ENCOUNTER — Encounter: Payer: Self-pay | Admitting: General Surgery

## 2020-02-23 ENCOUNTER — Telehealth: Payer: Self-pay

## 2020-02-23 ENCOUNTER — Other Ambulatory Visit: Payer: Self-pay

## 2020-02-23 VITALS — BP 162/106 | HR 78 | Temp 97.9°F | Ht 72.0 in | Wt 218.6 lb

## 2020-02-23 DIAGNOSIS — K429 Umbilical hernia without obstruction or gangrene: Secondary | ICD-10-CM

## 2020-02-23 NOTE — Telephone Encounter (Signed)
Patient came by the office requesting a refill on Prednisone, given to him by the rheumatologist for a gout flare up.   He does not need it at the present time but wants to keep the RX on hand "in case he needs it". The rheumatologist he has seen has moved out of town.    [facility] [address]

## 2020-02-23 NOTE — Telephone Encounter (Signed)
Wanted to add to note - the Rheumatologist he seen was Dr. Dayna Barker and the Prednisone was 10 mg. Taper.

## 2020-02-23 NOTE — Progress Notes (Signed)
Patient ID: Howard Sanders, male   DOB: 1965-04-09, 55 y.o.   MRN: [id number]  Chief Complaint  Patient presents with  . New Patient (Initial Visit)    Ventral hernia    HPI Howard Sanders is a 55 y.o. male.  He is here today as a referral from the emergency department.  He presented there on February 17, 2020 complaining of abdominal pain.  He is employed as a Arboriculturist.  He reports that he fell off of his truck and landed on his buttocks, but struck his abdomen on a ladder as he fell.  The accident occurred approximately 2 weeks prior to his presentation in the emergency department.  He was seen by a physicians assistant who ordered a CT scan.  The radiology report does not describe a hernia, but on my review of the imaging, he does have a small umbilical hernia.  He was also found to have a cystic structure at the inferior pole of his left kidney and has been referred to urology for further evaluation.  His primary complaint today is that bending over causes pain in his mid abdomen.  He denies any vomiting, but does endorse some nausea from time to time.  He does report having more frequent loose stools since the accident, but denies any symptoms of obstruction.   Past Medical History:  Diagnosis Date  . Chronic tophaceous gout   . High cholesterol   . Hypertension     Past Surgical History:  Procedure Laterality Date  . ELBOW SURGERY Left 1981   Due to fractured done  . NECK SURGERY     swollen lymph nodes removed on each side of neck    Family History  Problem Relation Age of Onset  . Alcohol abuse Father     Social History Social History   Tobacco Use  . Smoking status: Current Every Day Smoker    Packs/day: 1.50    Years: 24.00    Pack years: 36.00    Types: Cigarettes  . Smokeless tobacco: Former Neurosurgeon  . Tobacco comment: smoked 1 1/2 ppd since age 68  Substance Use Topics  . Alcohol use: No    Allergies  Allergen Reactions  . Bee Venom Anaphylaxis   . Iodine Anaphylaxis    On 02/17/20 patient tells me that he had upper extremity and facial swelling after topical iodine. LP  . Metformin And Related     Stomach pain, cramping, dehydration when dose was increased to 1000mg  twice a day in 2018    Current Outpatient Medications  Medication Sig Dispense Refill  . allopurinol (ZYLOPRIM) 300 MG tablet Take 1 tablet (300 mg total) by mouth daily. 90 tablet 3  . colchicine 0.6 MG tablet Take 1 tablet (0.6 mg total) by mouth daily as needed (gout). 90 tablet 4  . furosemide (LASIX) 20 MG tablet Take 1 tablet (20 mg total) by mouth daily. For swelling 90 tablet 3  . lisinopril-hydrochlorothiazide (ZESTORETIC) 10-12.5 MG tablet Take 1 tablet by mouth daily. 90 tablet 2  . lovastatin (MEVACOR) 40 MG tablet TAKE 1 TABLET (40 MG TOTAL) BY MOUTH AT BEDTIME. (Patient not taking: Reported on 02/23/2020) 90 tablet 3   No current facility-administered medications for this visit.    Review of Systems Review of Systems  Gastrointestinal: Positive for abdominal pain, diarrhea and nausea.  All other systems reviewed and are negative. Or as discussed in the history of present illness.  Blood pressure (!) 162/106, pulse 78,  temperature 97.9 F (36.6 C), temperature source Oral, height 6' (1.829 m), weight 218 lb 9.6 oz (99.2 kg), SpO2 97 %. Body mass index is 29.65 kg/m.  Physical Exam Physical Exam Constitutional:      General: He is not in acute distress.    Appearance: Normal appearance.  HENT:     Head: Normocephalic and atraumatic.     Nose:     Comments: Covered with a mask    Mouth/Throat:     Comments: Covered with a mask Eyes:     General: No scleral icterus.       Right eye: No discharge.        Left eye: No discharge.  Cardiovascular:     Rate and Rhythm: Normal rate and regular rhythm.  Pulmonary:     Effort: Pulmonary effort is normal.     Breath sounds: Normal breath sounds.  Abdominal:     General: Bowel sounds are normal.      Palpations: Abdomen is soft.       Comments: Diastasis rectus is present.  He is tender just above the umbilicus.  There is a small, reducible umbilical hernia present.  Musculoskeletal:        General: No swelling or deformity.     Cervical back: No rigidity.  Lymphadenopathy:     Cervical: No cervical adenopathy.  Skin:    General: Skin is warm and dry.  Neurological:     General: No focal deficit present.     Mental Status: He is alert.  Psychiatric:        Mood and Affect: Mood normal.        Behavior: Behavior normal.     Data Reviewed I reviewed the electronic medical record of his emergency department visit as summarized in the history of present illness.  I also personally reviewed the CT scan performed during that evaluation.  Although the radiologist does not comment upon it, he does have a small, fat-containing umbilical hernia.  There is no ventral hernia aside from this, but he does have diastasis rectus.  Assessment I discussed rectus diastasis with the patient today.  Upon further discussion, he revealed that he previously weighed over 350 pounds and has lost a substantial amount of weight.  I discussed that this is a common finding in obese man and that we typically do not surgically repair it.  I am not certain of the etiology of his diarrhea, but some of the pain that he may be experiencing could be secondary to a deep muscle bruise from the impact on the ladder.  He does have an umbilical hernia that he would like to have repaired and I have offered to perform this for him.  He is aware that he will need to refrain from lifting, pushing, or pulling anything heavier than 10 pounds for a total of 6 weeks from the time of his surgery.  Plan We will schedule him for a robot-assisted laparoscopic umbilical hernia repair.  I discussed the risks of the procedure with him.  These include, but are not limited to, bleeding, infection, damage to surrounding tissues or  structures (including bowel), mesh complications, hernia recurrence, risks of anesthesia, need for additional interventions or procedures.  I told him that I would be happy to evaluate his upper abdominal wall during the surgery to confirm that my clinical impression as well as the CT imaging is correct and that this is rectus diastasis and not something more significant.  He was  satisfied with this and we will work on scheduling him for repair of his umbilical hernia.    Fredirick Maudlin 02/23/2020, 3:11 PM

## 2020-02-23 NOTE — H&P (View-Only) (Signed)
Patient ID: Howard Sanders, male   DOB: 1965-04-09, 55 y.o.   MRN: [id number]  Chief Complaint  Patient presents with  . New Patient (Initial Visit)    Ventral hernia    HPI Howard Sanders is a 55 y.o. male.  He is here today as a referral from the emergency department.  He presented there on February 17, 2020 complaining of abdominal pain.  He is employed as a Arboriculturist.  He reports that he fell off of his truck and landed on his buttocks, but struck his abdomen on a ladder as he fell.  The accident occurred approximately 2 weeks prior to his presentation in the emergency department.  He was seen by a physicians assistant who ordered a CT scan.  The radiology report does not describe a hernia, but on my review of the imaging, he does have a small umbilical hernia.  He was also found to have a cystic structure at the inferior pole of his left kidney and has been referred to urology for further evaluation.  His primary complaint today is that bending over causes pain in his mid abdomen.  He denies any vomiting, but does endorse some nausea from time to time.  He does report having more frequent loose stools since the accident, but denies any symptoms of obstruction.   Past Medical History:  Diagnosis Date  . Chronic tophaceous gout   . High cholesterol   . Hypertension     Past Surgical History:  Procedure Laterality Date  . ELBOW SURGERY Left 1981   Due to fractured done  . NECK SURGERY     swollen lymph nodes removed on each side of neck    Family History  Problem Relation Age of Onset  . Alcohol abuse Father     Social History Social History   Tobacco Use  . Smoking status: Current Every Day Smoker    Packs/day: 1.50    Years: 24.00    Pack years: 36.00    Types: Cigarettes  . Smokeless tobacco: Former Neurosurgeon  . Tobacco comment: smoked 1 1/2 ppd since age 68  Substance Use Topics  . Alcohol use: No    Allergies  Allergen Reactions  . Bee Venom Anaphylaxis   . Iodine Anaphylaxis    On 02/17/20 patient tells me that he had upper extremity and facial swelling after topical iodine. LP  . Metformin And Related     Stomach pain, cramping, dehydration when dose was increased to 1000mg  twice a day in 2018    Current Outpatient Medications  Medication Sig Dispense Refill  . allopurinol (ZYLOPRIM) 300 MG tablet Take 1 tablet (300 mg total) by mouth daily. 90 tablet 3  . colchicine 0.6 MG tablet Take 1 tablet (0.6 mg total) by mouth daily as needed (gout). 90 tablet 4  . furosemide (LASIX) 20 MG tablet Take 1 tablet (20 mg total) by mouth daily. For swelling 90 tablet 3  . lisinopril-hydrochlorothiazide (ZESTORETIC) 10-12.5 MG tablet Take 1 tablet by mouth daily. 90 tablet 2  . lovastatin (MEVACOR) 40 MG tablet TAKE 1 TABLET (40 MG TOTAL) BY MOUTH AT BEDTIME. (Patient not taking: Reported on 02/23/2020) 90 tablet 3   No current facility-administered medications for this visit.    Review of Systems Review of Systems  Gastrointestinal: Positive for abdominal pain, diarrhea and nausea.  All other systems reviewed and are negative. Or as discussed in the history of present illness.  Blood pressure (!) 162/106, pulse 78,  temperature 97.9 F (36.6 C), temperature source Oral, height 6' (1.829 m), weight 218 lb 9.6 oz (99.2 kg), SpO2 97 %. Body mass index is 29.65 kg/m.  Physical Exam Physical Exam Constitutional:      General: He is not in acute distress.    Appearance: Normal appearance.  HENT:     Head: Normocephalic and atraumatic.     Nose:     Comments: Covered with a mask    Mouth/Throat:     Comments: Covered with a mask Eyes:     General: No scleral icterus.       Right eye: No discharge.        Left eye: No discharge.  Cardiovascular:     Rate and Rhythm: Normal rate and regular rhythm.  Pulmonary:     Effort: Pulmonary effort is normal.     Breath sounds: Normal breath sounds.  Abdominal:     General: Bowel sounds are normal.      Palpations: Abdomen is soft.       Comments: Diastasis rectus is present.  He is tender just above the umbilicus.  There is a small, reducible umbilical hernia present.  Musculoskeletal:        General: No swelling or deformity.     Cervical back: No rigidity.  Lymphadenopathy:     Cervical: No cervical adenopathy.  Skin:    General: Skin is warm and dry.  Neurological:     General: No focal deficit present.     Mental Status: He is alert.  Psychiatric:        Mood and Affect: Mood normal.        Behavior: Behavior normal.     Data Reviewed I reviewed the electronic medical record of his emergency department visit as summarized in the history of present illness.  I also personally reviewed the CT scan performed during that evaluation.  Although the radiologist does not comment upon it, he does have a small, fat-containing umbilical hernia.  There is no ventral hernia aside from this, but he does have diastasis rectus.  Assessment I discussed rectus diastasis with the patient today.  Upon further discussion, he revealed that he previously weighed over 350 pounds and has lost a substantial amount of weight.  I discussed that this is a common finding in obese man and that we typically do not surgically repair it.  I am not certain of the etiology of his diarrhea, but some of the pain that he may be experiencing could be secondary to a deep muscle bruise from the impact on the ladder.  He does have an umbilical hernia that he would like to have repaired and I have offered to perform this for him.  He is aware that he will need to refrain from lifting, pushing, or pulling anything heavier than 10 pounds for a total of 6 weeks from the time of his surgery.  Plan We will schedule him for a robot-assisted laparoscopic umbilical hernia repair.  I discussed the risks of the procedure with him.  These include, but are not limited to, bleeding, infection, damage to surrounding tissues or  structures (including bowel), mesh complications, hernia recurrence, risks of anesthesia, need for additional interventions or procedures.  I told him that I would be happy to evaluate his upper abdominal wall during the surgery to confirm that my clinical impression as well as the CT imaging is correct and that this is rectus diastasis and not something more significant.  He was  satisfied with this and we will work on scheduling him for repair of his umbilical hernia.    Howard Sanders 02/23/2020, 3:11 PM   

## 2020-02-23 NOTE — Patient Instructions (Addendum)
Our surgery scheduler Britta Mccreedy will contact you within 24-48 hours to get you scheduled. Make sure you have the blue sheet available when she calls to write down important information. If you have not heard anything by Friday 01/07, please give our office a call.     Umbilical Hernia, Adult  A hernia is a bulge of tissue that pushes through an opening between muscles. An umbilical hernia happens in the abdomen, near the belly button (umbilicus). The hernia may contain tissues from the small intestine, large intestine, or fatty tissue covering the intestines (omentum). Umbilical hernias in adults tend to get worse over time, and they require surgical treatment. There are several types of umbilical hernias. You may have:  A hernia located just above or below the umbilicus (indirect hernia). This is the most common type of umbilical hernia in adults.  A hernia that forms through an opening formed by the umbilicus (direct hernia).  A hernia that comes and goes (reducible hernia). A reducible hernia may be visible only when you strain, lift something heavy, or cough. This type of hernia can be pushed back into the abdomen (reduced).  A hernia that traps abdominal tissue inside the hernia (incarcerated hernia). This type of hernia cannot be reduced.  A hernia that cuts off blood flow to the tissues inside the hernia (strangulated hernia). The tissues can start to die if this happens. This type of hernia requires emergency treatment. What are the causes? An umbilical hernia happens when tissue inside the abdomen presses on a weak area of the abdominal muscles. What increases the risk? You may have a greater risk of this condition if you:  Are obese.  Have had several pregnancies.  Have a buildup of fluid inside your abdomen (ascites).  Have had surgery that weakens the abdominal muscles. What are the signs or symptoms? The main symptom of this condition is a painless bulge at or near the  belly button. A reducible hernia may be visible only when you strain, lift something heavy, or cough. Other symptoms may include:  Dull pain.  A feeling of pressure. Symptoms of a strangulated hernia may include:  Pain that gets increasingly worse.  Nausea and vomiting.  Pain when pressing on the hernia.  Skin over the hernia becoming red or purple.  Constipation.  Blood in the stool. How is this diagnosed? This condition may be diagnosed based on:  A physical exam. You may be asked to cough or strain while standing. These actions increase the pressure inside your abdomen and force the hernia through the opening in your muscles. Your health care provider may try to reduce the hernia by pressing on it.  Your symptoms and medical history. How is this treated? Surgery is the only treatment for an umbilical hernia. Surgery for a strangulated hernia is done as soon as possible. If you have a small hernia that is not incarcerated, you may need to lose weight before having surgery. Follow these instructions at home:  Lose weight, if told by your health care provider.  Do not try to push the hernia back in.  Watch your hernia for any changes in color or size. Tell your health care provider if any changes occur.  You may need to avoid activities that increase pressure on your hernia.  Do not lift anything that is heavier than 10 lb (4.5 kg) until your health care provider says that this is safe.  Take over-the-counter and prescription medicines only as told by your health care  provider.  Keep all follow-up visits as told by your health care provider. This is important. Contact a health care provider if:  Your hernia gets larger.  Your hernia becomes painful. Get help right away if:  You develop sudden, severe pain near the area of your hernia.  You have pain as well as nausea or vomiting.  You have pain and the skin over your hernia changes color.  You develop a  fever. This information is not intended to replace advice given to you by your health care provider. Make sure you discuss any questions you have with your health care provider. Document Revised: 03/20/2017 Document Reviewed: 08/06/2016 Elsevier Patient Education  2020 [employer].

## 2020-02-24 ENCOUNTER — Telehealth: Payer: Self-pay | Admitting: General Surgery

## 2020-02-24 NOTE — Telephone Encounter (Signed)
Patient has been advised of Pre-Admission date/time, COVID Testing date and Surgery date.  Surgery Date: 03/18/20 Preadmission Testing Date: 03/11/20 (phone 8a-1p) Covid Testing Date: 03/16/20 - patient advised to go to [facility] ([address] [place]) between 8a-1p   Patient has been made aware to call [phone], between 1-3:00pm the day before surgery, to find out what time to arrive for surgery.    

## 2020-02-26 ENCOUNTER — Telehealth: Payer: Self-pay | Admitting: *Deleted

## 2020-02-26 NOTE — Telephone Encounter (Signed)
Faxed FMLA/Occupational Accident Plan to [employer]Solon [place] at [phone]

## 2020-02-28 ENCOUNTER — Encounter: Payer: Self-pay | Admitting: Emergency Medicine

## 2020-02-28 ENCOUNTER — Other Ambulatory Visit: Payer: Self-pay

## 2020-02-28 ENCOUNTER — Emergency Department
Admission: EM | Admit: 2020-02-28 | Discharge: 2020-02-28 | Disposition: A | Discharge status: Home / Self Care | Payer: Self-pay | Attending: Emergency Medicine | Admitting: Emergency Medicine

## 2020-02-28 DIAGNOSIS — X58XXXA Exposure to other specified factors, initial encounter: Secondary | ICD-10-CM | POA: Insufficient documentation

## 2020-02-28 DIAGNOSIS — I10 Essential (primary) hypertension: Secondary | ICD-10-CM | POA: Insufficient documentation

## 2020-02-28 DIAGNOSIS — T162XXA Foreign body in left ear, initial encounter: Secondary | ICD-10-CM | POA: Insufficient documentation

## 2020-02-28 DIAGNOSIS — E1169 Type 2 diabetes mellitus with other specified complication: Secondary | ICD-10-CM | POA: Insufficient documentation

## 2020-02-28 DIAGNOSIS — Z79899 Other long term (current) drug therapy: Secondary | ICD-10-CM | POA: Insufficient documentation

## 2020-02-28 DIAGNOSIS — F17210 Nicotine dependence, cigarettes, uncomplicated: Secondary | ICD-10-CM | POA: Insufficient documentation

## 2020-02-28 DIAGNOSIS — E781 Pure hyperglyceridemia: Secondary | ICD-10-CM | POA: Insufficient documentation

## 2020-02-28 MED ORDER — LIDOCAINE VISCOUS HCL 2 % MT SOLN
15.0000 mL | Freq: Once | OROMUCOSAL | Status: AC
  Administered 2020-02-28: 15 mL via OROMUCOSAL
  Filled 2020-02-28: qty 15

## 2020-02-28 MED ORDER — TETRACAINE HCL 0.5 % OP SOLN
2.0000 [drp] | Freq: Once | OPHTHALMIC | Status: AC
  Administered 2020-02-28: 2 [drp] via OPHTHALMIC
  Filled 2020-02-28: qty 4

## 2020-02-28 NOTE — ED Triage Notes (Signed)
Pt reports insect in his left ear since early this am.

## 2020-02-28 NOTE — ED Provider Notes (Signed)
[facility]-EMERGENCY DEPARTMENT  ____________________________________________  Time seen: Approximately 7:14 PM  I have reviewed the triage vital signs and the nursing notes.   HISTORY  Chief Complaint Foreign Body in Ear   Historian Patient     HPI Howard Sanders is a 55 y.o. male presents to the emergency department with a left ear foreign body.  Patient states that he felt an insect moving inside of his ear this morning.  He states that he flushed ear with warm all of oil and hydrogen peroxide but was unable to remove the insect.  No other alleviating measures have been attempted.   Past Medical History:  Diagnosis Date  . Chronic tophaceous gout   . High cholesterol   . Hypertension      Immunizations up to date:  Yes.     Past Medical History:  Diagnosis Date  . Chronic tophaceous gout   . High cholesterol   . Hypertension     Patient Active Problem List   Diagnosis Date Noted  . Chronic tophaceous gout 12/29/2018  . Fatty liver 10/07/2017  . Occult blood in stools 08/09/2016  . Smoking greater than 30 pack years 08/06/2016  . Hypertriglyceridemia 11/03/2014  . Hypertension 11/03/2014  . Edema 11/03/2014  . Diabetes (HCC) 11/03/2014    Past Surgical History:  Procedure Laterality Date  . ELBOW SURGERY Left 1981   Due to fractured done  . NECK SURGERY     swollen lymph nodes removed on each side of neck    Prior to Admission medications   Medication Sig Start Date End Date Taking? Authorizing Provider  allopurinol (ZYLOPRIM) 300 MG tablet Take 1 tablet (300 mg total) by mouth daily. 11/23/19   Malva Limes, MD  colchicine 0.6 MG tablet Take 1 tablet (0.6 mg total) by mouth daily as needed (gout). 11/23/19   Malva Limes, MD  furosemide (LASIX) 20 MG tablet Take 1 tablet (20 mg total) by mouth daily. For swelling 05/22/19   Malva Limes, MD  lisinopril-hydrochlorothiazide (ZESTORETIC) 10-12.5 MG tablet Take 1 tablet by mouth daily. 11/23/19    Malva Limes, MD  lovastatin (MEVACOR) 40 MG tablet TAKE 1 TABLET (40 MG TOTAL) BY MOUTH AT BEDTIME. Patient not taking: Reported on 02/23/2020 01/07/20   Malva Limes, MD    Allergies Bee venom, Iodine, and Metformin and related  Family History  Problem Relation Age of Onset  . Alcohol abuse Father     Social History Social History   Tobacco Use  . Smoking status: Current Every Day Smoker    Packs/day: 1.50    Years: 24.00    Pack years: 36.00    Types: Cigarettes  . Smokeless tobacco: Former Neurosurgeon  . Tobacco comment: smoked 1 1/2 ppd since age 65  Substance Use Topics  . Alcohol use: No     Review of Systems  Constitutional: No fever/chills Eyes:  No discharge ENT: Patient has left ear pain.  Respiratory: no cough. No SOB/ use of accessory muscles to breath Gastrointestinal:   No nausea, no vomiting.  No diarrhea.  No constipation. Musculoskeletal: Negative for musculoskeletal pain. Skin: Negative for rash, abrasions, lacerations, ecchymosis.    ____________________________________________   PHYSICAL EXAM:  VITAL SIGNS: ED Triage Vitals  Enc Vitals Group     BP 02/28/20 1733 133/81     Pulse Rate 02/28/20 1733 86     Resp 02/28/20 1733 16     Temp 02/28/20 1733 98.2 F (36.8 C)  Temp Source 02/28/20 1733 Oral     SpO2 02/28/20 1733 99 %     Weight 02/28/20 1724 219 lb (99.3 kg)     Height 02/28/20 1724 6' (1.829 m)     Head Circumference --      Peak Flow --      Pain Score 02/28/20 1724 0     Pain Loc --      Pain Edu? --      Excl. in GC? --      Constitutional: Alert and oriented. Well appearing and in no acute distress. Eyes: Conjunctivae are normal. PERRL. EOMI. Head: Atraumatic. ENT:      Ears: Patient has insect foreign body.  Once insect was removed, TM was pearly with scant bleeding in external auditory canal on the left.      Nose: No congestion/rhinnorhea.      Mouth/Throat: Mucous membranes are moist.  Neck: No stridor.   No cervical spine tenderness to palpation. Cardiovascular: Normal rate, regular rhythm. Normal S1 and S2.  Good peripheral circulation. Respiratory: Normal respiratory effort without tachypnea or retractions. Lungs CTAB. Good air entry to the bases with no decreased or absent breath sounds Gastrointestinal: Bowel sounds x 4 quadrants. Soft and nontender to palpation. No guarding or rigidity. No distention. Musculoskeletal: Full range of motion to all extremities. No obvious deformities noted Neurologic:  Normal for age. No gross focal neurologic deficits are appreciated.  Skin:  Skin is warm, dry and intact. No rash noted. Psychiatric: Mood and affect are normal for age. Speech and behavior are normal.   ____________________________________________   LABS (all labs ordered are listed, but only abnormal results are displayed)  Labs Reviewed - No data to display ____________________________________________  EKG   ____________________________________________  RADIOLOGY   No results found.  ____________________________________________    PROCEDURES  Procedure(s) performed:     Procedures     Medications  lidocaine (XYLOCAINE) 2 % viscous mouth solution 15 mL (15 mLs Mouth/Throat Given 02/28/20 1912)  tetracaine (PONTOCAINE) 0.5 % ophthalmic solution 2 drop (2 drops Right Eye Given 02/28/20 1913)     ____________________________________________   INITIAL IMPRESSION / ASSESSMENT AND PLAN / ED COURSE  Pertinent labs & imaging results that were available during my care of the patient were reviewed by me and considered in my medical decision making (see chart for details).      Assessment and plan Ear foreign body 55 year old male presents to the emergency department with an insect foreign body of the left ear.  Insect foreign body was removed after patient's external ear was anesthetized using viscous lidocaine and tetracaine.  All patient questions were  answered.     ____________________________________________  FINAL CLINICAL IMPRESSION(S) / ED DIAGNOSES  Final diagnoses:  Foreign body of left ear, initial encounter      NEW MEDICATIONS STARTED DURING THIS VISIT:  ED Discharge Orders    None          This chart was dictated using voice recognition software/Dragon. Despite best efforts to proofread, errors can occur which can change the meaning. Any change was purely unintentional.     Orvil Feil, PA-C 02/28/20 1918    Sharman Cheek, MD 02/29/20 [phone]

## 2020-02-29 ENCOUNTER — Other Ambulatory Visit: Payer: Self-pay | Admitting: Family Medicine

## 2020-03-02 ENCOUNTER — Ambulatory Visit (INDEPENDENT_AMBULATORY_CARE_PROVIDER_SITE_OTHER): Payer: Self-pay | Admitting: Urology

## 2020-03-02 ENCOUNTER — Encounter: Payer: Self-pay | Admitting: Urology

## 2020-03-02 ENCOUNTER — Other Ambulatory Visit: Payer: Self-pay

## 2020-03-02 VITALS — BP 123/76 | HR 81 | Ht 72.0 in | Wt 214.5 lb

## 2020-03-02 DIAGNOSIS — N289 Disorder of kidney and ureter, unspecified: Secondary | ICD-10-CM

## 2020-03-02 NOTE — Progress Notes (Signed)
   03/02/20 12:22 PM   Howard Sanders 02/02/1966 [id number]  CC: Renal mass  HPI: I saw Howard Sanders in urology clinic today for evaluation of a 1.2 cm exophytic left renal mass.  He is a 55 year old male who had a fall off of his truck and a CT was performed in the ER on 02/17/2020.  This showed a 1.2 cm indeterminate lesion of the left lower kidney which may represent a hemorrhagic cyst or solid renal mass.  On chart review, he has a CT from June 2018 at [facility] with contrast that showed a 1 cm indeterminate left renal exophytic lesion.  He is a 36-pack-year smoking history and continues to smoke.  He denies any gross hematuria or urinary symptoms.   PMH: Past Medical History:  Diagnosis Date  . Chronic tophaceous gout   . High cholesterol   . Hypertension     Surgical History: Past Surgical History:  Procedure Laterality Date  . ELBOW SURGERY Left 1981   Due to fractured done  . NECK SURGERY     swollen lymph nodes removed on each side of neck    Family History: Family History  Problem Relation Age of Onset  . Alcohol abuse Father     Social History:  reports that he has been smoking cigarettes. He has a 36.00 pack-year smoking history. He has quit using smokeless tobacco. He reports that he does not drink alcohol and does not use drugs.  Physical Exam: BP 123/76 (BP Location: Left Arm, Patient Position: Sitting, Cuff Size: Large)   Pulse 81   Ht 6' (1.829 m)   Wt 214 lb 8 oz (97.3 kg)   BMI 29.09 kg/m    Constitutional:  Alert and oriented, No acute distress. Cardiovascular: No clubbing, cyanosis, or edema. Respiratory: Normal respiratory effort, no increased work of breathing. GI: Abdomen is soft, nontender, nondistended, no abdominal masses GU: No CVA tenderness  Laboratory Data: Reviewed Creatinine 0.94, EGFR greater than 60  Pertinent Imaging: I have personally viewed and interpreted the CT dated 02/17/2020.  1.2 cm exophytic left renal lesion that likely  represents a hemorrhagic cyst based on the fact that it is minimally changed from CT prior in 2018  Assessment & Plan:   55 year old male with a 1.2 cm exophytic left renal lesion that most likely represents a hemorrhagic cyst based on recent CT findings and stability since CT from June 2018 that shows a similar lesion.  We discussed the very low, but nonzero, risk of malignancy, however the stability over the last 3 years is very reassuring.  We discussed options moving forward including MRI, renal ultrasound, or biopsy.  I recommended a repeat renal ultrasound in 6 to 12 months with the stability over the last 3 years, and he is in agreement.  RTC 1 year with renal ultrasound  Nickolas Madrid, MD 03/02/2020  [facility] [address] [place], [place] [address] [phone]

## 2020-03-09 ENCOUNTER — Ambulatory Visit: Payer: Self-pay | Admitting: Family Medicine

## 2020-03-11 ENCOUNTER — Other Ambulatory Visit
Admission: RE | Admit: 2020-03-11 | Discharge: 2020-03-11 | Disposition: A | Discharge status: Home / Self Care | Payer: Self-pay | Source: Ambulatory Visit | Attending: General Surgery | Admitting: General Surgery

## 2020-03-11 ENCOUNTER — Other Ambulatory Visit: Payer: Self-pay

## 2020-03-11 HISTORY — DX: Other complications of anesthesia, initial encounter: T88.59XA

## 2020-03-11 NOTE — Patient Instructions (Signed)
Your procedure is scheduled on: Friday March 18, 2020. Report to Day Surgery inside [facility] 2nd floor (stop by admissions desk on first floor, before going upstairs). To find out your arrival time please call [phone] between 1PM - 3PM on Thursday March 17, 2020.  Remember: Instructions that are not followed completely may result in serious medical risk,  up to and including death, or upon the discretion of your surgeon and anesthesiologist your  surgery may need to be rescheduled.     _X__ 1. Do not eat food after midnight the night before your procedure.                 No chewing gum or hard candies. You may drink clear liquids up to 2 hours                 before you are scheduled to arrive for your surgery- DO not drink clear                 liquids within 2 hours of the start of your surgery.                 Clear Liquids include:  water, apple juice without pulp, clear Gatorade, G2 or                  Gatorade Zero (avoid Red/Purple/Blue), Black Coffee or Tea (Do not add                 anything to coffee or tea).  __X__2.  On the morning of surgery brush your teeth with toothpaste and water, you                may rinse your mouth with mouthwash if you wish.  Do not swallow any toothpaste of mouthwash.     _X__ 3.  No Alcohol for 24 hours before or after surgery.   _X__ 4.  Do Not Smoke or use e-cigarettes For 24 Hours Prior to Your Surgery.                 Do not use any chewable tobacco products for at least 6 hours prior to                 Surgery.  _X__  5.  Do not use any recreational drugs (marijuana, cocaine, heroin, ecstasy, MDMA or other)                For at least one week prior to your surgery.  Combination of these drugs with anesthesia                May have life threatening results.  __x__ 6.  Notify your doctor if there is any change in your medical condition      (cold, fever, infections).     Do not wear jewelry,  make-up, hairpins, clips or nail polish. Do not wear lotions, powders, or perfumes. You may wear deodorant. Do not shave 48 hours prior to surgery. Men may shave face and neck. Do not bring valuables to the hospital.    [facility] is not responsible for any belongings or valuables.  Contacts, dentures or bridgework may not be worn into surgery. Leave your suitcase in the car. After surgery it may be brought to your room. For patients admitted to the hospital, discharge time is determined by your treatment team.   Patients discharged the day of surgery will not be allowed to  drive home.   Make arrangements for someone to be with you for the first 24 hours of your Same Day Discharge.    __x__ Take these medicines the morning of surgery with A SIP OF WATER:    1. None     ____ Fleet Enema (as directed)   __x__ Use CHG Soap as directed  ____ Use Benzoyl Peroxide Gel as instructed  ____ Use inhalers on the day of surgery  ____ Stop metformin 2 days prior to surgery    ____ Take 1/2 of usual insulin dose the night before surgery. No insulin the morning          of surgery.   __x__ Stop Anti-inflammatories Ibuprofen, Aleve, Advil, naproxen, aspirin and or BC powders.    __x__ Stop supplements until after surgery.    __x__ Do not start any herbal supplements supplements before your procedure.    If you have any questions regarding your pre-procedure instructions,  Please call Pre-admit Testing at [phone].

## 2020-03-16 ENCOUNTER — Other Ambulatory Visit
Admission: RE | Admit: 2020-03-16 | Discharge: 2020-03-16 | Disposition: A | Discharge status: Home / Self Care | Payer: Self-pay | Source: Ambulatory Visit | Attending: General Surgery | Admitting: General Surgery

## 2020-03-16 ENCOUNTER — Other Ambulatory Visit: Payer: Self-pay

## 2020-03-16 DIAGNOSIS — Z20822 Contact with and (suspected) exposure to covid-19: Secondary | ICD-10-CM | POA: Insufficient documentation

## 2020-03-16 DIAGNOSIS — Z01818 Encounter for other preprocedural examination: Secondary | ICD-10-CM | POA: Insufficient documentation

## 2020-03-17 LAB — SARS CORONAVIRUS 2 (TAT 6-24 HRS): SARS Coronavirus 2: NEGATIVE

## 2020-03-18 ENCOUNTER — Encounter
Admission: RE | Disposition: A | Discharge status: Home / Self Care | Payer: Self-pay | Source: Home / Self Care | Attending: General Surgery

## 2020-03-18 ENCOUNTER — Encounter: Payer: Self-pay | Admitting: General Surgery

## 2020-03-18 ENCOUNTER — Ambulatory Visit
Admission: RE | Admit: 2020-03-18 | Discharge: 2020-03-18 | Disposition: A | Discharge status: Home / Self Care | Payer: Worker's Compensation | Attending: General Surgery | Admitting: General Surgery

## 2020-03-18 ENCOUNTER — Ambulatory Visit: Payer: Worker's Compensation | Admitting: Anesthesiology

## 2020-03-18 ENCOUNTER — Other Ambulatory Visit: Payer: Self-pay

## 2020-03-18 DIAGNOSIS — K429 Umbilical hernia without obstruction or gangrene: Secondary | ICD-10-CM | POA: Diagnosis not present

## 2020-03-18 DIAGNOSIS — Z811 Family history of alcohol abuse and dependence: Secondary | ICD-10-CM | POA: Insufficient documentation

## 2020-03-18 DIAGNOSIS — Z888 Allergy status to other drugs, medicaments and biological substances status: Secondary | ICD-10-CM | POA: Diagnosis not present

## 2020-03-18 DIAGNOSIS — F17210 Nicotine dependence, cigarettes, uncomplicated: Secondary | ICD-10-CM | POA: Diagnosis not present

## 2020-03-18 DIAGNOSIS — Z79899 Other long term (current) drug therapy: Secondary | ICD-10-CM | POA: Diagnosis not present

## 2020-03-18 DIAGNOSIS — M6208 Separation of muscle (nontraumatic), other site: Secondary | ICD-10-CM | POA: Insufficient documentation

## 2020-03-18 SURGERY — REPAIR, HERNIA, UMBILICAL, ROBOT-ASSISTED
Anesthesia: General | Site: Abdomen

## 2020-03-18 MED ORDER — EPHEDRINE SULFATE 50 MG/ML IJ SOLN
INTRAMUSCULAR | Status: DC | PRN
  Administered 2020-03-18 (×2): 5 mg via INTRAVENOUS

## 2020-03-18 MED ORDER — ACETAMINOPHEN 500 MG PO TABS
ORAL_TABLET | ORAL | Status: AC
  Administered 2020-03-18: 1000 mg via ORAL
  Filled 2020-03-18: qty 2

## 2020-03-18 MED ORDER — PHENYLEPHRINE HCL (PRESSORS) 10 MG/ML IV SOLN
INTRAVENOUS | Status: DC | PRN
  Administered 2020-03-18: 200 ug via INTRAVENOUS

## 2020-03-18 MED ORDER — FENTANYL CITRATE (PF) 100 MCG/2ML IJ SOLN
25.0000 ug | INTRAMUSCULAR | Status: DC | PRN
  Administered 2020-03-18: 25 ug via INTRAVENOUS

## 2020-03-18 MED ORDER — GABAPENTIN 300 MG PO CAPS
ORAL_CAPSULE | ORAL | Status: AC
  Administered 2020-03-18: 300 mg via ORAL
  Filled 2020-03-18: qty 1

## 2020-03-18 MED ORDER — IBUPROFEN 800 MG PO TABS: 800.0000 mg | ORAL_TABLET | Freq: Three times a day (TID) | ORAL | 0 refills | Status: DC | PRN

## 2020-03-18 MED ORDER — LIDOCAINE HCL (CARDIAC) PF 100 MG/5ML IV SOSY
PREFILLED_SYRINGE | INTRAVENOUS | Status: DC | PRN
  Administered 2020-03-18: 100 mg via INTRAVENOUS

## 2020-03-18 MED ORDER — CEFAZOLIN SODIUM-DEXTROSE 2-4 GM/100ML-% IV SOLN
2.0000 g | INTRAVENOUS | Status: AC
  Administered 2020-03-18: 2 g via INTRAVENOUS

## 2020-03-18 MED ORDER — CELECOXIB 200 MG PO CAPS: 200.0000 mg | ORAL_CAPSULE | ORAL | Status: AC

## 2020-03-18 MED ORDER — FAMOTIDINE 20 MG PO TABS
ORAL_TABLET | ORAL | Status: AC
  Administered 2020-03-18: 20 mg via ORAL
  Filled 2020-03-18: qty 1

## 2020-03-18 MED ORDER — MIDAZOLAM HCL 2 MG/2ML IJ SOLN
INTRAMUSCULAR | Status: DC | PRN
  Administered 2020-03-18: 2 mg via INTRAVENOUS

## 2020-03-18 MED ORDER — OXYCODONE HCL 5 MG PO TABS
ORAL_TABLET | ORAL | Status: AC
  Administered 2020-03-18: 5 mg via ORAL
  Filled 2020-03-18: qty 1

## 2020-03-18 MED ORDER — CHLORHEXIDINE GLUCONATE CLOTH 2 % EX PADS: 6.0000 | MEDICATED_PAD | Freq: Once | CUTANEOUS | Status: DC

## 2020-03-18 MED ORDER — OXYCODONE HCL 5 MG PO TABS: 5.0000 mg | ORAL_TABLET | Freq: Once | ORAL | Status: AC | PRN

## 2020-03-18 MED ORDER — OXYCODONE HCL 5 MG/5ML PO SOLN: 5.0000 mg | Freq: Once | ORAL | Status: AC | PRN

## 2020-03-18 MED ORDER — LACTATED RINGERS IV SOLN: INTRAVENOUS | Status: DC | PRN

## 2020-03-18 MED ORDER — HYDROCODONE-ACETAMINOPHEN 5-325 MG PO TABS: 1.0000 | ORAL_TABLET | Freq: Four times a day (QID) | ORAL | 0 refills | Status: DC | PRN

## 2020-03-18 MED ORDER — LACTATED RINGERS IV SOLN: INTRAVENOUS | Status: DC

## 2020-03-18 MED ORDER — MIDAZOLAM HCL 2 MG/2ML IJ SOLN
INTRAMUSCULAR | Status: AC
  Filled 2020-03-18: qty 2

## 2020-03-18 MED ORDER — FENTANYL CITRATE (PF) 100 MCG/2ML IJ SOLN
INTRAMUSCULAR | Status: AC
  Administered 2020-03-18: 25 ug via INTRAVENOUS
  Filled 2020-03-18: qty 2

## 2020-03-18 MED ORDER — CELECOXIB 200 MG PO CAPS
ORAL_CAPSULE | ORAL | Status: AC
  Administered 2020-03-18: 200 mg via ORAL
  Filled 2020-03-18: qty 1

## 2020-03-18 MED ORDER — PROPOFOL 10 MG/ML IV BOLUS
INTRAVENOUS | Status: DC | PRN
  Administered 2020-03-18: 150 mg via INTRAVENOUS

## 2020-03-18 MED ORDER — FENTANYL CITRATE (PF) 100 MCG/2ML IJ SOLN
INTRAMUSCULAR | Status: AC
  Filled 2020-03-18: qty 2

## 2020-03-18 MED ORDER — DEXAMETHASONE SODIUM PHOSPHATE 10 MG/ML IJ SOLN
INTRAMUSCULAR | Status: DC | PRN
  Administered 2020-03-18: 10 mg via INTRAVENOUS

## 2020-03-18 MED ORDER — LIDOCAINE-EPINEPHRINE 1 %-1:100000 IJ SOLN
INTRAMUSCULAR | Status: DC | PRN
  Administered 2020-03-18: 4 mL

## 2020-03-18 MED ORDER — ONDANSETRON HCL 4 MG/2ML IJ SOLN
INTRAMUSCULAR | Status: DC | PRN
  Administered 2020-03-18: 4 mg via INTRAVENOUS

## 2020-03-18 MED ORDER — FAMOTIDINE 20 MG PO TABS: 20.0000 mg | ORAL_TABLET | Freq: Once | ORAL | Status: AC

## 2020-03-18 MED ORDER — PROPOFOL 10 MG/ML IV BOLUS
INTRAVENOUS | Status: AC
  Filled 2020-03-18: qty 20

## 2020-03-18 MED ORDER — LIDOCAINE-EPINEPHRINE 1 %-1:100000 IJ SOLN
INTRAMUSCULAR | Status: AC
  Filled 2020-03-18: qty 1

## 2020-03-18 MED ORDER — BUPIVACAINE HCL 0.25 % IJ SOLN
INTRAMUSCULAR | Status: DC | PRN
  Administered 2020-03-18: 4 mL

## 2020-03-18 MED ORDER — CEFAZOLIN SODIUM-DEXTROSE 2-4 GM/100ML-% IV SOLN
INTRAVENOUS | Status: AC
  Filled 2020-03-18: qty 100

## 2020-03-18 MED ORDER — SUGAMMADEX SODIUM 200 MG/2ML IV SOLN
INTRAVENOUS | Status: DC | PRN
  Administered 2020-03-18: 200 mg via INTRAVENOUS

## 2020-03-18 MED ORDER — ACETAMINOPHEN 500 MG PO TABS: 1000.0000 mg | ORAL_TABLET | ORAL | Status: AC

## 2020-03-18 MED ORDER — ORAL CARE MOUTH RINSE: 15.0000 mL | Freq: Once | OROMUCOSAL | Status: AC

## 2020-03-18 MED ORDER — FENTANYL CITRATE (PF) 100 MCG/2ML IJ SOLN
INTRAMUSCULAR | Status: DC | PRN
  Administered 2020-03-18 (×2): 50 ug via INTRAVENOUS
  Administered 2020-03-18: 100 ug via INTRAVENOUS

## 2020-03-18 MED ORDER — CHLORHEXIDINE GLUCONATE 0.12 % MT SOLN
OROMUCOSAL | Status: AC
  Administered 2020-03-18: 15 mL via OROMUCOSAL
  Filled 2020-03-18: qty 15

## 2020-03-18 MED ORDER — GABAPENTIN 300 MG PO CAPS: 300.0000 mg | ORAL_CAPSULE | ORAL | Status: AC

## 2020-03-18 MED ORDER — SEVOFLURANE IN SOLN
RESPIRATORY_TRACT | Status: AC
  Filled 2020-03-18: qty 250

## 2020-03-18 MED ORDER — BUPIVACAINE HCL (PF) 0.25 % IJ SOLN
INTRAMUSCULAR | Status: AC
  Filled 2020-03-18: qty 30

## 2020-03-18 MED ORDER — CHLORHEXIDINE GLUCONATE 0.12 % MT SOLN: 15.0000 mL | Freq: Once | OROMUCOSAL | Status: AC

## 2020-03-18 MED ORDER — SODIUM CHLORIDE FLUSH 0.9 % IV SOLN
INTRAVENOUS | Status: AC
  Filled 2020-03-18: qty 10

## 2020-03-18 SURGICAL SUPPLY — 51 items
ADH SKN CLS APL DERMABOND .7 (GAUZE/BANDAGES/DRESSINGS) ×1
APL PRP STRL LF DISP 70% ISPRP (MISCELLANEOUS) ×1
CANISTER SUCT 1200ML W/VALVE (MISCELLANEOUS) IMPLANT
CHLORAPREP W/TINT 26 (MISCELLANEOUS) ×2 IMPLANT
COVER TIP SHEARS 8 DVNC (MISCELLANEOUS) ×1 IMPLANT
COVER TIP SHEARS 8MM DA VINCI (MISCELLANEOUS) ×1
COVER WAND RF STERILE (DRAPES) ×2 IMPLANT
DEFOGGER SCOPE WARMER CLEARIFY (MISCELLANEOUS) ×2 IMPLANT
DERMABOND ADVANCED (GAUZE/BANDAGES/DRESSINGS) ×1
DERMABOND ADVANCED .7 DNX12 (GAUZE/BANDAGES/DRESSINGS) ×1 IMPLANT
DRAPE ARM DVNC X/XI (DISPOSABLE) ×3 IMPLANT
DRAPE COLUMN DVNC XI (DISPOSABLE) ×1 IMPLANT
DRAPE DA VINCI XI ARM (DISPOSABLE) ×3
DRAPE DA VINCI XI COLUMN (DISPOSABLE) ×1
ELECT CAUTERY BLADE TIP 2.5 (TIP) ×2
ELECT REM PT RETURN 9FT ADLT (ELECTROSURGICAL) ×2
ELECTRODE CAUTERY BLDE TIP 2.5 (TIP) ×1 IMPLANT
ELECTRODE REM PT RTRN 9FT ADLT (ELECTROSURGICAL) ×1 IMPLANT
GLOVE INDICATOR 7.0 STRL GRN (GLOVE) ×4 IMPLANT
GLOVE SURG ENC MOIS LTX SZ6.5 (GLOVE) ×4 IMPLANT
GOWN STRL REUS W/ TWL LRG LVL3 (GOWN DISPOSABLE) ×3 IMPLANT
GOWN STRL REUS W/TWL LRG LVL3 (GOWN DISPOSABLE) ×6
IRRIGATOR SUCT 8 DISP DVNC XI (IRRIGATION / IRRIGATOR) IMPLANT
IRRIGATOR SUCTION 8MM XI DISP (IRRIGATION / IRRIGATOR)
IV NS 1000ML (IV SOLUTION)
IV NS 1000ML BAXH (IV SOLUTION) IMPLANT
KIT PINK PAD W/HEAD ARE REST (MISCELLANEOUS) ×2
KIT PINK PAD W/HEAD ARM REST (MISCELLANEOUS) ×1 IMPLANT
LABEL OR SOLS (LABEL) ×2 IMPLANT
MANIFOLD NEPTUNE II (INSTRUMENTS) IMPLANT
MESH VENTRALIGHT ST 4.5IN (Mesh General) ×2 IMPLANT
NEEDLE HYPO 22GX1.5 SAFETY (NEEDLE) ×2 IMPLANT
NEEDLE INSUFFLATION 14GA 120MM (NEEDLE) ×2 IMPLANT
NS IRRIG 500ML POUR BTL (IV SOLUTION) ×2 IMPLANT
OBTURATOR OPTICAL STANDARD 8MM (TROCAR) ×1
OBTURATOR OPTICAL STND 8 DVNC (TROCAR) ×1
OBTURATOR OPTICALSTD 8 DVNC (TROCAR) ×1 IMPLANT
PACK LAP CHOLECYSTECTOMY (MISCELLANEOUS) ×2 IMPLANT
PENCIL ELECTRO HAND CTR (MISCELLANEOUS) ×2 IMPLANT
SEAL CANN UNIV 5-8 DVNC XI (MISCELLANEOUS) ×3 IMPLANT
SEAL XI 5MM-8MM UNIVERSAL (MISCELLANEOUS) ×3
SET TUBE SMOKE EVAC HIGH FLOW (TUBING) ×2 IMPLANT
SOLUTION ELECTROLUBE (MISCELLANEOUS) ×2 IMPLANT
STRIP CLOSURE SKIN 1/2X4 (GAUZE/BANDAGES/DRESSINGS) ×2 IMPLANT
SUT MNCRL 4-0 (SUTURE) ×2
SUT MNCRL 4-0 27XMFL (SUTURE) ×1
SUT STRATAFIX PDS 30 CT-1 (SUTURE) IMPLANT
SUT VICRYL 0 AB UR-6 (SUTURE) ×2 IMPLANT
SUT VLOC 0 9X23 GS-22 BLUE (SUTURE) ×2 IMPLANT
SUT VLOC 90 S/L VL9 GS22 (SUTURE) ×2 IMPLANT
SUTURE MNCRL 4-0 27XMF (SUTURE) ×1 IMPLANT

## 2020-03-18 NOTE — Anesthesia Preprocedure Evaluation (Signed)
Anesthesia Evaluation  Patient identified by MRN, date of birth, ID band Patient awake    Reviewed: Allergy & Precautions, H&P , NPO status , Patient's Chart, lab work & pertinent test results  History of Anesthesia Complications (+) Emergence Delirium and history of anesthetic complications  Airway Mallampati: III  TM Distance: <3 FB Neck ROM: full    Dental  (+) Chipped, Poor Dentition, Missing   Pulmonary COPD, Current Smoker and Patient abstained from smoking.,    Pulmonary exam normal        Cardiovascular Exercise Tolerance: Good hypertension, (-) angina(-) Past MI and (-) DOE negative cardio ROS Normal cardiovascular exam     Neuro/Psych negative neurological ROS  negative psych ROS   GI/Hepatic negative GI ROS, Neg liver ROS, neg GERD  ,  Endo/Other  diabetes, Type 2  Renal/GU      Musculoskeletal  (+) Arthritis ,   Abdominal   Peds  Hematology negative hematology ROS (+)   Anesthesia Other Findings Past Medical History: No date: Chronic tophaceous gout No date: Complication of anesthesia     Comment:  as a young child was very combative  No date: High cholesterol No date: Hypertension  Past Surgical History: 1981: ELBOW SURGERY; Left     Comment:  Due to fractured done No date: NECK SURGERY     Comment:  swollen lymph nodes removed on each side of neck  BMI    Body Mass Index: 29.03 kg/m      Reproductive/Obstetrics negative OB ROS                             Anesthesia Physical Anesthesia Plan  ASA: III  Anesthesia Plan: General ETT   Post-op Pain Management:    Induction: Intravenous  PONV Risk Score and Plan: Ondansetron, Dexamethasone, Midazolam and Treatment may vary due to age or medical condition  Airway Management Planned: Oral ETT  Additional Equipment:   Intra-op Plan:   Post-operative Plan: Extubation in OR  Informed Consent: I have  reviewed the patients History and Physical, chart, labs and discussed the procedure including the risks, benefits and alternatives for the proposed anesthesia with the patient or authorized representative who has indicated his/her understanding and acceptance.     Dental Advisory Given  Plan Discussed with: Anesthesiologist, CRNA and Surgeon  Anesthesia Plan Comments: (Patient consented for risks of anesthesia including but not limited to:  - adverse reactions to medications - damage to eyes, teeth, lips or other oral mucosa - nerve damage due to positioning  - sore throat or hoarseness - Damage to heart, brain, nerves, lungs, other parts of body or loss of life  Patient voiced understanding.)        Anesthesia Quick Evaluation

## 2020-03-18 NOTE — Anesthesia Postprocedure Evaluation (Signed)
Anesthesia Post Note  Patient: Howard Sanders  Procedure(s) Performed: XI ROBOT ASSISTED UMBILICAL HERNIA REPAIR (N/A Abdomen)  Patient location during evaluation: PACU Anesthesia Type: General Level of consciousness: awake and alert Pain management: pain level controlled Vital Signs Assessment: post-procedure vital signs reviewed and stable Respiratory status: spontaneous breathing, nonlabored ventilation, respiratory function stable and patient connected to nasal cannula oxygen Cardiovascular status: blood pressure returned to baseline and stable Postop Assessment: no apparent nausea or vomiting Anesthetic complications: no   No complications documented.   Last Vitals:  Vitals:   03/18/20 1110 03/18/20 1118  BP: 111/72 132/82  Pulse: 87 92  Resp: 20 20  Temp: (!) 36.3 C (!) 36.3 C  SpO2: 95% 94%    Last Pain:  Vitals:   03/18/20 1118  TempSrc: Temporal  PainSc: 3                  Cleda Mccreedy Shaune Westfall

## 2020-03-18 NOTE — Anesthesia Procedure Notes (Signed)
Procedure Name: Intubation Date/Time: 03/18/2020 7:50 AM Performed by: Danelle Berry, CRNA Pre-anesthesia Checklist: Patient identified, Emergency Drugs available, Suction available and Patient being monitored Patient Re-evaluated:Patient Re-evaluated prior to induction Oxygen Delivery Method: Circle system utilized Preoxygenation: Pre-oxygenation with 100% oxygen Induction Type: IV induction Ventilation: Mask ventilation without difficulty Laryngoscope Size: McGraph and 3 Tube type: Oral Tube size: 7.5 mm Number of attempts: 1 Airway Equipment and Method: Stylet and Oral airway Placement Confirmation: ETT inserted through vocal cords under direct vision,  positive ETCO2 and breath sounds checked- equal and bilateral Tube secured with: Tape Dental Injury: Teeth and Oropharynx as per pre-operative assessment

## 2020-03-18 NOTE — Op Note (Signed)
Robot-Assisted Laparoscopic Ventral Hernia Repair (IPOM) with Mesh   Pre-operative Diagnosis: ventral hernia (umbilical)   Post-operative Diagnosis: same   Surgeon: [name], MD, FACS   Anesthesia: GETA   Findings: Small, 1 cm defect containing preperitoneal fat.            Procedure in detail: The patient was seen again in the holding room. The benefits, complications, treatment options, and expected outcomes were discussed with the patient. The risks of bleeding, infection, recurrence of symptoms, failure to resolve symptoms, bowel injury, mesh placement, mesh infection, any of which could require further surgery were reviewed with the patient. The likelihood of improving the patient's symptoms with return to their baseline status is good.  The patient and/or family concurred with the proposed plan, giving informed consent.   The patient was brought to the operating room and placed supine on the OR table.  All bony prominences were padded.  Prior to the induction of general anesthesia, antibiotic prophylaxis was administered. VTE prophylaxis was in place. General endotracheal anesthesia was then administered and tolerated well. The patient was then sterilely prepped and draped in standard fashion.   A Veress needle was used to access the abdomen at Palmer's point.  Placement was verified with 2 clicks and the saline meniscus test.  Pneumoperitoneum was achieved without any hemodynamic compromise.  Pneumoperitoneum was achieved without any hemodynamic compromise.  Optiview technique was used to place a robotic 8 mm port just adjacent to the Veress needle.  Two additional robotic 8 mm ports were placed under direct visualization.  All port sites were infiltrated with local anesthesia before placement.  The hernia was visualized with laparoscopy and the defect measured approximately 1 cm.   The robot was brought to the surgical field and docked in standard fashion.  All instruments were kept  under direct vision at all times and there was no collision between the robotic arms. I scrubbed out and went to the console.   The peritoneum was incised and dissected circumferentially off of the fascia surrounding the defect.  The hernia sac was reduced.  The peritoneum was transected, yielding 2 flaps with adequate exposure of the posterior abdominal wall fascia to permit good adherence of our mesh.  I confirmed that the defect was 1 centimeter and selected an 4.5 inch Ventralight ST mesh that I subsequently trimmed to a more appropriate size.  The hernia defect was closed using an 0 V-Loc suture.  The mesh was then introduced into the abdomen and unfurled, keeping the coated side towards the abdominal contents.  The needle from the 0 V-Loc suture was used to pin the mesh to the now closed the defect in a chandelier fashion.   The mesh was secured circumferentially to the posterior abdominal wall using 2 0 V-Loc sutures.   Care was taken to ensure that the mesh lay flat against the abdominal wall without any buckling and without any tension.   The suture needles were removed under direct visualization.  The instruments were removed and the robot was undocked and I scrubbed back in. The laparoscopic ports were removed under direct visualization and the pneumoperitoneum was released.   Each incision was closed with subcuticular 4-0 Monocryl.  The skin was cleaned.  Dermabond and Steri-Strips were applied.   The patient was awakened, extubated, and taken to the postanesthesia care unit in good condition.  EBL: Less than 5 cc   Complications: none immediately apparent   Count: All needles instruments and sponges were counted  and reported to be correct number at the end of the operation.  [name], MD FACS

## 2020-03-18 NOTE — Interval H&P Note (Signed)
History and Physical Interval Note:  03/18/2020 7:18 AM  Howard Sanders  has presented today for surgery, with the diagnosis of umbilical hernia.  The various methods of treatment have been discussed with the patient and family. After consideration of risks, benefits and other options for treatment, the patient has consented to  Procedure(s): XI ROBOT ASSISTED UMBILICAL HERNIA REPAIR (N/A) as a surgical intervention.  The patient's history has been reviewed, patient examined, no change in status, stable for surgery.  I have reviewed the patient's chart and labs.  Questions were answered to the patient's satisfaction.     [name]

## 2020-03-18 NOTE — Transfer of Care (Signed)
Immediate Anesthesia Transfer of Care Note  Patient: Howard Sanders  Procedure(s) Performed: XI ROBOT ASSISTED UMBILICAL HERNIA REPAIR (N/A Abdomen)  Patient Location: PACU  Anesthesia Type:General  Level of Consciousness: sedated  Airway & Oxygen Therapy: Patient Spontanous Breathing and Patient connected to face mask oxygen  Post-op Assessment: Report given to RN and Post -op Vital signs reviewed and stable  Post vital signs: Reviewed and stable  Last Vitals:  Vitals Value Taken Time  BP 147/87 03/18/20 0932  Temp 36.4 C 03/18/20 0932  Pulse 85 03/18/20 0938  Resp 21 03/18/20 0938  SpO2 96 % 03/18/20 0938  Vitals shown include unvalidated device data.  Last Pain:  Vitals:   03/18/20 0932  TempSrc:   PainSc: Asleep         Complications: No complications documented.

## 2020-03-18 NOTE — Discharge Instructions (Signed)

## 2020-03-21 ENCOUNTER — Telehealth: Payer: Self-pay

## 2020-03-21 NOTE — Telephone Encounter (Signed)
Copied from CRM [id number]. Topic: General - Inquiry >> Mar 21, 2020  2:46 PM Areatha Keas wrote: Reason for CRM: Pt moved his appt to March to have at least a month on his new medication and he wanted to know if t the march appt will blood work be done again/ please advise

## 2020-03-21 NOTE — Telephone Encounter (Signed)
Patient advised.

## 2020-03-21 NOTE — Telephone Encounter (Signed)
Yes, we will check those in March.

## 2020-03-21 NOTE — Telephone Encounter (Signed)
Looks like he would be due for an A1c and lipids. Please review if this would be done at the march visit. Thanks!

## 2020-03-31 ENCOUNTER — Ambulatory Visit (INDEPENDENT_AMBULATORY_CARE_PROVIDER_SITE_OTHER): Payer: Worker's Compensation | Admitting: General Surgery

## 2020-03-31 ENCOUNTER — Encounter: Payer: Self-pay | Admitting: General Surgery

## 2020-03-31 ENCOUNTER — Other Ambulatory Visit: Payer: Self-pay

## 2020-03-31 ENCOUNTER — Other Ambulatory Visit: Payer: Self-pay | Admitting: Family Medicine

## 2020-03-31 VITALS — BP 141/98 | HR 89 | Temp 98.4°F | Ht 72.0 in | Wt 222.4 lb

## 2020-03-31 DIAGNOSIS — Z09 Encounter for follow-up examination after completed treatment for conditions other than malignant neoplasm: Secondary | ICD-10-CM

## 2020-03-31 NOTE — Patient Instructions (Addendum)

## 2020-03-31 NOTE — Progress Notes (Signed)
Mr. Topper is here today for a postoperative visit. He is a 55 year old man who underwent robot-assisted laparoscopic umbilical hernia repair on 03/18/2020. Reports that he has been doing well since his operation. He denies any fevers or chills. No nausea or vomiting. Bowels are moving regularly and his appetite is good. He does endorse some typical postoperative discomfort, but the sharp stabbing pain that brought him to surgery is gone.  Today's Vitals   03/31/20 0930  BP: (!) 141/98  Pulse: 89  Temp: 98.4 F (36.9 C)  TempSrc: Oral  SpO2: 95%  Weight: 222 lb 6.4 oz (100.9 kg)  Height: 6' (1.829 m)   Body mass index is 30.16 kg/m. Focused examination demonstrates that his robot trocar sites are healing well. There is a little bit of scabbing on the 2nd site from the top. There is a normal amount of postoperative swelling around the umbilicus, but it is not particularly tender.  Impression plan: This is a 55 year old man who had a small umbilical hernia with incarcerated fat. It was causing him significant pain and he sought surgical intervention. He underwent a successful robot-assisted laparoscopic umbilical hernia repair and is doing well. He should continue to refrain from lifting, pushing, or pulling anything heavier than 10 lbs for a total of 6 weeks from the time of his operation. I will see him on an as-needed basis.

## 2020-04-08 ENCOUNTER — Ambulatory Visit: Payer: Self-pay | Admitting: Family Medicine

## 2020-04-08 ENCOUNTER — Telehealth: Payer: Self-pay

## 2020-04-08 NOTE — Telephone Encounter (Signed)
Looks like Dr. Sherrie Mustache placed an order for a carotid ultrasound on 01/04/2020. Was this ever scheduled? Please call patient with update and appointment.

## 2020-04-08 NOTE — Telephone Encounter (Signed)
Copied from CRM [id number]. Topic: General - Inquiry >> Apr 08, 2020 10:22 AM Daphine Deutscher D wrote: Reason for CRM: pt called asking about an [place] of his Carotid art.  He said his "eye dr" had ask Dr. Sherrie Mustache to order it back in Jan.  The patient had to have hernia surgery and was unable to get it done then.  He is now asking to sche that [place].  CB#  [phone]

## 2020-04-18 ENCOUNTER — Ambulatory Visit
Admission: RE | Admit: 2020-04-18 | Discharge: 2020-04-18 | Disposition: A | Discharge status: Home / Self Care | Payer: Self-pay | Source: Ambulatory Visit | Attending: Family Medicine | Admitting: Family Medicine

## 2020-04-18 ENCOUNTER — Other Ambulatory Visit: Payer: Self-pay

## 2020-04-18 DIAGNOSIS — H35031 Hypertensive retinopathy, right eye: Secondary | ICD-10-CM | POA: Insufficient documentation

## 2020-04-20 ENCOUNTER — Telehealth: Payer: Self-pay

## 2020-04-20 NOTE — Telephone Encounter (Signed)
Copied from CRM [id number]. Topic: General - Other >> Apr 20, 2020 11:14 AM Gaetana Michaelis A wrote: Reason for CRM: [facility] has made contact to notify of an incoming request for test results related to patient's Carotid Doppler test Please advise

## 2020-04-28 NOTE — Telephone Encounter (Signed)
Carotid ultrasound was normal. Can send them a copy of the report if they like.

## 2020-04-29 NOTE — Telephone Encounter (Signed)
Tried calling [facility]. Need fax number to send report. Left message to call back. OK for [facility] to ask for fax number if they return my call.

## 2020-05-02 ENCOUNTER — Other Ambulatory Visit: Payer: Self-pay | Admitting: Family Medicine

## 2020-05-02 DIAGNOSIS — M1A9XX1 Chronic gout, unspecified, with tophus (tophi): Secondary | ICD-10-CM

## 2020-05-02 MED ORDER — ALLOPURINOL 300 MG PO TABS: 300.0000 mg | ORAL_TABLET | Freq: Every day | ORAL | 1 refills | Status: DC

## 2020-05-02 NOTE — Telephone Encounter (Signed)
Attempted to contact [facility] message on voicemail to call office.

## 2020-05-02 NOTE — Telephone Encounter (Signed)
Medication Refill - Medication: Allopurinol   Has the patient contacted their pharmacy? Yes.   Pt states that he contacted pharmacy and that they need a new prescription. Pt states that he only has 4 pills. He has an appt scheduled for 06/20/20. Please advise.  (Agent: If no, request that the patient contact the pharmacy for the refill.) (Agent: If yes, when and what did the pharmacy advise?)  Preferred Pharmacy (with phone number or street name):  [id number] [place], [place] - [address]  [address] [place] [address]  Phone: [phone] Fax: [phone]  Hours: Not open 24 hours     Agent: Please be advised that RX refills may take up to 3 business days. We ask that you follow-up with your pharmacy.

## 2020-05-02 NOTE — Telephone Encounter (Signed)
Future appt in 1 month 

## 2020-05-03 NOTE — Telephone Encounter (Signed)
Copy of report faxed to [facility] at fax number [phone].

## 2020-05-09 ENCOUNTER — Ambulatory Visit: Payer: Self-pay | Admitting: Family Medicine

## 2020-06-20 ENCOUNTER — Ambulatory Visit: Payer: Self-pay | Admitting: Family Medicine

## 2020-06-21 NOTE — Progress Notes (Signed)
Established patient visit   Patient: Howard Sanders   DOB: 04-Feb-1966   55 y.o. Male  MRN: [id number] Visit Date: 06/22/2020  Today's healthcare provider: Mila Merry, MD   Chief Complaint  Patient presents with  . Diabetes  . Hypertension  . Hyperlipidemia   Subjective    HPI  Diabetes Mellitus Type II, follow-up  Lab Results  Component Value Date   HGBA1C 7.1 (H) 12/02/2019   HGBA1C 6.4 (A) 05/22/2019   HGBA1C 6.4 (H) 11/21/2018   Last seen for diabetes 6 months ago.  Management since then includes starting metformin ER 500mg  once a day. He reports good compliance with treatment. He is not having side effects.   Home blood sugar records: blood sugars are not checked regularly  Episodes of hypoglycemia? No    Current insulin regiment: none Most Recent Eye Exam: not UTD  --------------------------------------------------------------------------------------------------- Hypertension, follow-up  BP Readings from Last 3 Encounters:  06/22/20 126/80  03/31/20 (!) 141/98  03/18/20 132/82   Wt Readings from Last 3 Encounters:  06/22/20 226 lb 3.2 oz (102.6 kg)  03/31/20 222 lb 6.4 oz (100.9 kg)  03/18/20 214 lb 1.1 oz (97.1 kg)     He was last seen for hypertension 6 months ago.  BP at that visit was 118/69. Management since that visit includes continue current medication. He reports good compliance with treatment. He is not having side effects.  He is not exercising. He is not adherent to low salt diet, avoids carbohydrates, is 60% keto diet. States he has had eye exam recently and apparently had retinal hemorrhage.  Outside blood pressures are checked and average 117/76.  He does smoke.  Use of agents associated with hypertension: none.   ---------------------------------------------------------------------------------------------------  Lipid/Cholesterol, Follow-up  Last lipid panel Other pertinent labs  Lab Results  Component Value Date    CHOL 187 12/02/2019   HDL 24 (L) 12/02/2019   LDLCALC 121 (H) 12/02/2019   TRIG 235 (H) 12/02/2019   CHOLHDL 7.8 (H) 12/02/2019   Lab Results  Component Value Date   ALT 20 02/17/2020   AST 18 02/17/2020   PLT 249 02/17/2020   TSH 1.330 12/02/2019     He was last seen for this 6 months ago.  Management since that visit includes starting atorvastatin 20mg  one tablet daily.  He reports good compliance with treatment. He is not having side effects.   Symptoms: No chest pain No chest pressure/discomfort  No dyspnea No lower extremity edema  No numbness or tingling of extremity No orthopnea  No palpitations No paroxysmal nocturnal dyspnea  No speech difficulty No syncope   Current diet: in general, an "unhealthy" diet Current exercise: none  The 10-year ASCVD risk score 12/04/2019 DC Jr., et al., 2013) is: 34%  ---------------------------------------------------------------------------------------------------    Medications: Outpatient Medications Prior to Visit  Medication Sig  . allopurinol (ZYLOPRIM) 300 MG tablet Take 1 tablet (300 mg total) by mouth daily.  . colchicine 0.6 MG tablet Take 1 tablet (0.6 mg total) by mouth daily as needed (gout). (Patient taking differently: Take 0.6 mg by mouth daily.)  . furosemide (LASIX) 20 MG tablet Take 1 tablet (20 mg total) by mouth daily. For swelling  . lisinopril-hydrochlorothiazide (ZESTORETIC) 10-12.5 MG tablet Take 1 tablet by mouth daily.  Denman George lovastatin (MEVACOR) 40 MG tablet TAKE 1 TABLET (40 MG TOTAL) BY MOUTH AT BEDTIME. (Patient taking differently: Take 40 mg by mouth every morning. TAKE 1 TABLET (40 MG TOTAL) BY  MOUTH)  . [DISCONTINUED] ibuprofen (ADVIL) 800 MG tablet Take 1 tablet (800 mg total) by mouth every 8 (eight) hours as needed. (Patient not taking: Reported on 06/22/2020)   No facility-administered medications prior to visit.    Review of Systems  Constitutional: Negative for appetite change, chills and fever.   Respiratory: Negative for chest tightness, shortness of breath and wheezing.   Cardiovascular: Positive for leg swelling. Negative for chest pain and palpitations.  Gastrointestinal: Negative for abdominal pain, nausea and vomiting.       Objective    BP 126/80 (BP Location: Left Arm, Patient Position: Sitting, Cuff Size: Normal)   Pulse 76   Temp 97.7 F (36.5 C) (Temporal)   Resp 18   Wt 226 lb 3.2 oz (102.6 kg)   BMI 30.68 kg/m     Physical Exam    General: Appearance:    Mildly obese male in no acute distress  Eyes:    PERRL, conjunctiva/corneas clear, EOM's intact       Lungs:     Clear to auscultation bilaterally, respirations unlabored  Heart:    Normal heart rate. Normal rhythm. No murmurs, rubs, or gallops.   MS:   All extremities are intact.   Neurologic:   Awake, alert, oriented x 3. No apparent focal neurological           defect.         Results for orders placed or performed in visit on 06/22/20  POCT HgB A1C  Result Value Ref Range   Hemoglobin A1C 6.7 (A) 4.0 - 5.6 %   Est. average glucose Bld gHb Est-mCnc 146     Assessment & Plan     1. Type 2 diabetes mellitus without complication, without long-term current use of insulin (HCC) Doing well with diet. Check a1c Q 6 months.   2. Hypertriglyceridemia  - Lipid panel refill lovastatin (MEVACOR) 40 MG tablet; Take 1 tablet (40 mg total) by mouth daily.  Dispense: 90 tablet; Refill: 4  3. Chronic tophaceous gout Refill - allopurinol (ZYLOPRIM) 300 MG tablet; Take 1 tablet (300 mg total) by mouth daily.  Dispense: 90 tablet; Refill: 4 - colchicine 0.6 MG tablet; Take 1 tablet (0.6 mg total) by mouth daily as needed (gout).  Dispense: 90 tablet; Refill: 2  4. Primary hypertensionEssential hypertension Well controlled.  Continue current medications.  refill   -furosemide (LASIX) 20 MG tablet; Take 1 tablet (20 mg total) by mouth daily. For swelling  Dispense: 90 tablet; Refill: 4 -  lisinopril-hydrochlorothiazide (ZESTORETIC) 10-12.5 MG tablet; Take 1 tablet by mouth daily.  Dispense: 90 tablet; Refill: 4   Future Appointments  Date Time Provider Department Center  12/26/2020  9:40 AM Malva Limes, MD [facility]  03/02/2021 10:45 AM Sondra Come, MD [facility] None         The entirety of the information documented in the History of Present Illness, Review of Systems and Physical Exam were personally obtained by me. Portions of this information were initially documented by the CMA and reviewed by me for thoroughness and accuracy.      Mila Merry, MD  [facility] [phone] (phone) [phone] (fax)  [facility]

## 2020-06-22 ENCOUNTER — Other Ambulatory Visit: Payer: Self-pay

## 2020-06-22 ENCOUNTER — Ambulatory Visit (INDEPENDENT_AMBULATORY_CARE_PROVIDER_SITE_OTHER): Payer: Self-pay | Admitting: Family Medicine

## 2020-06-22 ENCOUNTER — Encounter: Payer: Self-pay | Admitting: Family Medicine

## 2020-06-22 VITALS — BP 126/80 | HR 76 | Temp 97.7°F | Resp 18 | Wt 226.2 lb

## 2020-06-22 DIAGNOSIS — I10 Essential (primary) hypertension: Secondary | ICD-10-CM

## 2020-06-22 DIAGNOSIS — E119 Type 2 diabetes mellitus without complications: Secondary | ICD-10-CM

## 2020-06-22 DIAGNOSIS — E781 Pure hyperglyceridemia: Secondary | ICD-10-CM

## 2020-06-22 DIAGNOSIS — M1A9XX1 Chronic gout, unspecified, with tophus (tophi): Secondary | ICD-10-CM

## 2020-06-22 LAB — POCT GLYCOSYLATED HEMOGLOBIN (HGB A1C)
Est. average glucose Bld gHb Est-mCnc: 146
Hemoglobin A1C: 6.7 % — AB (ref 4.0–5.6)

## 2020-06-22 MED ORDER — FUROSEMIDE 20 MG PO TABS
20.0000 mg | ORAL_TABLET | Freq: Every day | ORAL | 4 refills | Status: DC
Start: 2020-06-22 — End: 2022-02-15

## 2020-06-22 MED ORDER — LOVASTATIN 40 MG PO TABS
40.0000 mg | ORAL_TABLET | Freq: Every day | ORAL | 4 refills | Status: DC
Start: 2020-06-22 — End: 2022-05-30

## 2020-06-22 MED ORDER — LISINOPRIL-HYDROCHLOROTHIAZIDE 10-12.5 MG PO TABS: 1.0000 | ORAL_TABLET | Freq: Every day | ORAL | 4 refills | Status: DC

## 2020-06-22 MED ORDER — COLCHICINE 0.6 MG PO TABS: 0.6000 mg | ORAL_TABLET | Freq: Every day | ORAL | 2 refills | Status: DC | PRN

## 2020-06-22 MED ORDER — ALLOPURINOL 300 MG PO TABS
300.0000 mg | ORAL_TABLET | Freq: Every day | ORAL | 4 refills | Status: DC
Start: 2020-06-22 — End: 2022-05-28

## 2020-06-22 NOTE — Patient Instructions (Signed)
Please go to the lab draw station in [address] on the second floor of [facility]  when you are fasting for 8 hours. Normal hours are 8:00am to 11:30am and 1:00pm to 4:00pm Monday through Friday  

## 2020-11-28 ENCOUNTER — Encounter: Payer: Self-pay | Admitting: General Surgery

## 2020-12-26 ENCOUNTER — Ambulatory Visit: Payer: Self-pay | Admitting: Family Medicine

## 2021-03-02 ENCOUNTER — Other Ambulatory Visit: Payer: Self-pay | Admitting: Urology

## 2021-03-02 ENCOUNTER — Ambulatory Visit: Payer: Self-pay | Admitting: Urology

## 2021-03-02 DIAGNOSIS — N2889 Other specified disorders of kidney and ureter: Secondary | ICD-10-CM
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
## 2022-02-15 ENCOUNTER — Other Ambulatory Visit: Payer: Self-pay | Admitting: Family Medicine

## 2022-02-15 DIAGNOSIS — I10 Essential (primary) hypertension: Secondary | ICD-10-CM

## 2022-02-15 NOTE — Telephone Encounter (Signed)
Medication Refill - Medication: isinopril-hydrochlorothiazide (ZESTORETIC) 10-12.5 MG tablet , furosemide (LASIX) 20 MG tablet   Has the patient contacted their pharmacy? No.  Preferred Pharmacy (with phone number or street name):  [id number] - [place], [place] [address] Phone: [phone]  Fax: [phone]     Has the patient been seen for an appointment in the last year OR does the patient have an upcoming appointment? Yes.    The patient has a physical scheduled for January 8th but has been out of his meds. Please assist patient further

## 2022-02-17 NOTE — Telephone Encounter (Signed)
Patient called and advised he will need a CPE scheduled, agrees to first available. He says he's been out of his BP medication for about 6 months, BP running around 158/90's-100 most days. I advised due to overdue lab work, I would have to send this request to Dr. Sherrie Mustache for approval of a courtesy refill until upcoming physical in February. He verbalized understanding.

## 2022-02-17 NOTE — Telephone Encounter (Signed)
Requested medication (s) are due for refill today: Yes  Requested medication (s) are on the active medication list: Yes  Last refill:  06/22/20  Future visit scheduled: Yes  Notes to clinic:  Unable to refill per protocol due to failed labs, no updated results, appointment needed.     Requested Prescriptions  Pending Prescriptions Disp Refills   lisinopril-hydrochlorothiazide (ZESTORETIC) 10-12.5 MG tablet 90 tablet 4    Sig: Take 1 tablet by mouth daily.     Cardiovascular:  ACEI + Diuretic Combos Failed - 02/15/2022  3:21 PM      Failed - Na in normal range and within 180 days    Sodium  Date Value Ref Range Status  02/17/2020 139 135 - 145 mmol/L Final  11/21/2018 140 134 - 144 mmol/L Final  09/09/2011 142 136 - 145 mmol/L Final         Failed - K in normal range and within 180 days    Potassium  Date Value Ref Range Status  02/17/2020 3.8 3.5 - 5.1 mmol/L Final  09/09/2011 3.8 3.5 - 5.1 mmol/L Final         Failed - Cr in normal range and within 180 days    Creatinine  Date Value Ref Range Status  09/09/2011 0.97 0.60 - 1.30 mg/dL Final   Creatinine, Ser  Date Value Ref Range Status  02/17/2020 0.94 0.61 - 1.24 mg/dL Final         Failed - eGFR is 30 or above and within 180 days    EGFR (African American)  Date Value Ref Range Status  09/09/2011 >60  Final   GFR calc Af Amer  Date Value Ref Range Status  11/21/2018 119 >59 mL/min/1.73 Final   EGFR (Non-African Amer.)  Date Value Ref Range Status  09/09/2011 >60  Final    Comment:    eGFR values <71m/min/1.73 m2 may be an indication of chronic kidney disease (CKD). Calculated eGFR is useful in patients with stable renal function. The eGFR calculation will not be reliable in acutely ill patients when serum creatinine is changing rapidly. It is not useful in  patients on dialysis. The eGFR calculation may not be applicable to patients at the low and high extremes of body sizes, pregnant women, and  vegetarians.    GFR, Estimated  Date Value Ref Range Status  02/17/2020 >60 >60 mL/min Final    Comment:    (NOTE) Calculated using the CKD-EPI Creatinine Equation (2021)          Failed - Valid encounter within last 6 months    Recent Outpatient Visits           1 year ago Type 2 diabetes mellitus without complication, without long-term current use of insulin ([place]   [facility]   2 years ago Type 2 diabetes mellitus without complication, without long-term current use of insulin ([place]   [facility]   2 years ago Type 2 diabetes mellitus without complication, without long-term current use of insulin ([facility]   [facility]   3 years ago Idiopathic chronic gout of multiple sites with tophus   [place] [facility] Muff PA-C   4 years ago Type 2 diabetes mellitus without complication, without long-term current use of insulin ([facility]   [facility]       Future Appointments  In 1 month Fisher, Kirstie Peri, MD [facility] [place] - Patient is not pregnant      Passed - Last BP in normal range    BP Readings from Last 1 Encounters:  06/22/20 126/80          furosemide (LASIX) 20 MG tablet 90 tablet 4    Sig: Take 1 tablet (20 mg total) by mouth daily. For swelling     Cardiovascular:  Diuretics - Loop Failed - 02/15/2022  3:21 PM      Failed - K in normal range and within 180 days    Potassium  Date Value Ref Range Status  02/17/2020 3.8 3.5 - 5.1 mmol/L Final  09/09/2011 3.8 3.5 - 5.1 mmol/L Final         Failed - Ca in normal range and within 180 days    Calcium  Date Value Ref Range Status  02/17/2020 9.3 8.9 - 10.3 mg/dL Final   Calcium, Total  Date Value Ref Range Status  09/09/2011 8.6 8.5 - 10.1 mg/dL Final         Failed - Na in normal range and within  180 days    Sodium  Date Value Ref Range Status  02/17/2020 139 135 - 145 mmol/L Final  11/21/2018 140 134 - 144 mmol/L Final  09/09/2011 142 136 - 145 mmol/L Final         Failed - Cr in normal range and within 180 days    Creatinine  Date Value Ref Range Status  09/09/2011 0.97 0.60 - 1.30 mg/dL Final   Creatinine, Ser  Date Value Ref Range Status  02/17/2020 0.94 0.61 - 1.24 mg/dL Final         Failed - Cl in normal range and within 180 days    Chloride  Date Value Ref Range Status  02/17/2020 102 98 - 111 mmol/L Final  09/09/2011 107 98 - 107 mmol/L Final         Failed - Mg Level in normal range and within 180 days    No results found for: "MG"       Failed - Valid encounter within last 6 months    Recent Outpatient Visits           1 year ago Type 2 diabetes mellitus without complication, without long-term current use of insulin ([place])   [facility] Birdie Sons, MD   2 years ago Type 2 diabetes mellitus without complication, without long-term current use of insulin ([place])   [facility] Birdie Sons, MD   2 years ago Type 2 diabetes mellitus without complication, without long-term current use of insulin ([place])   [facility] Birdie Sons, MD   3 years ago Idiopathic chronic gout of multiple sites with tophus   [place], Vickki Muff, PA-C   4 years ago Type 2 diabetes mellitus without complication, without long-term current use of insulin [facility])   [facility] Birdie Sons, MD       Future Appointments             In 1 month Fisher, Kirstie Peri, MD [facility]            Passed - Last BP in normal range    BP Readings from Last 1 Encounters:  06/22/20 126/80

## 2022-02-20 MED ORDER — FUROSEMIDE 20 MG PO TABS: 20.0000 mg | ORAL_TABLET | Freq: Every day | ORAL | 0 refills | Status: DC

## 2022-02-20 MED ORDER — LISINOPRIL-HYDROCHLOROTHIAZIDE 10-12.5 MG PO TABS: 1.0000 | ORAL_TABLET | Freq: Every day | ORAL | 0 refills | Status: DC

## 2022-03-30 ENCOUNTER — Encounter: Payer: Self-pay | Admitting: Family Medicine

## 2022-04-05 ENCOUNTER — Telehealth: Payer: Self-pay | Admitting: Family Medicine

## 2022-04-25 ENCOUNTER — Encounter: Payer: Self-pay | Admitting: Family Medicine

## 2022-05-18 ENCOUNTER — Other Ambulatory Visit: Payer: Self-pay | Admitting: Family Medicine

## 2022-05-18 DIAGNOSIS — I10 Essential (primary) hypertension: Secondary | ICD-10-CM

## 2022-05-28 ENCOUNTER — Ambulatory Visit (INDEPENDENT_AMBULATORY_CARE_PROVIDER_SITE_OTHER): Payer: Self-pay | Admitting: Family Medicine

## 2022-05-28 VITALS — BP 132/85 | HR 87 | Temp 97.5°F | Wt 210.0 lb

## 2022-05-28 DIAGNOSIS — I10 Essential (primary) hypertension: Secondary | ICD-10-CM

## 2022-05-28 DIAGNOSIS — Z125 Encounter for screening for malignant neoplasm of prostate: Secondary | ICD-10-CM

## 2022-05-28 DIAGNOSIS — Z0000 Encounter for general adult medical examination without abnormal findings: Secondary | ICD-10-CM

## 2022-05-28 DIAGNOSIS — M1A9XX1 Chronic gout, unspecified, with tophus (tophi): Secondary | ICD-10-CM

## 2022-05-28 DIAGNOSIS — H3530 Unspecified macular degeneration: Secondary | ICD-10-CM

## 2022-05-28 DIAGNOSIS — E119 Type 2 diabetes mellitus without complications: Secondary | ICD-10-CM

## 2022-05-28 DIAGNOSIS — F17210 Nicotine dependence, cigarettes, uncomplicated: Secondary | ICD-10-CM

## 2022-05-28 DIAGNOSIS — Z1211 Encounter for screening for malignant neoplasm of colon: Secondary | ICD-10-CM

## 2022-05-28 DIAGNOSIS — E781 Pure hyperglyceridemia: Secondary | ICD-10-CM

## 2022-05-28 MED ORDER — ALLOPURINOL 300 MG PO TABS: 300.0000 mg | ORAL_TABLET | Freq: Every day | ORAL | 1 refills | Status: DC

## 2022-05-28 MED ORDER — COLCHICINE 0.6 MG PO TABS: 0.6000 mg | ORAL_TABLET | Freq: Every day | ORAL | 2 refills | Status: DC | PRN

## 2022-05-28 NOTE — Progress Notes (Signed)
Vivien Rota DeSanto,acting as a scribe for Mila Merry, MD.,have documented all relevant documentation on the behalf of Mila Merry, MD,as directed by  Mila Merry, MD while in the presence of Mila Merry, MD.    Complete physical exam   Patient: Howard Sanders   DOB: 04/17/1965   57 y.o. Male  MRN: [id number] Visit Date: 05/28/2022  Today's healthcare provider: Mila Merry, MD   No chief complaint on file.  Subjective    Howard Sanders is a 57 y.o. male who presents today for a complete physical exam.  He reports consuming a general diet. The patient does not participate in regular exercise at present. He generally feels well. He reports sleeping well. He does not have additional problems to discuss today.  HPI  Patient is only taking his Lisinopril HCT and Furosemide because he has run out of his other meds.  Past Medical History:  Diagnosis Date   Chronic tophaceous gout    Complication of anesthesia    as a young child was very combative    High cholesterol    Hypertension    Past Surgical History:  Procedure Laterality Date   ELBOW SURGERY Left 1981   Due to fractured done   NECK SURGERY     swollen lymph nodes removed on each side of neck   Social History   Socioeconomic History   Marital status: Single    Spouse name: Not on file   Number of children: Not on file   Years of education: Not on file   Highest education level: Not on file  Occupational History   Occupation: Flat bed truck driver  Tobacco Use   Smoking status: Every Day    Packs/day: 1.50    Years: 24.00    Additional pack years: 0.00    Total pack years: 36.00    Types: Cigarettes   Smokeless tobacco: Former   Tobacco comments:    smoked 1 1/2 ppd since age 7  Vaping Use   Vaping Use: Never used  Substance and Sexual Activity   Alcohol use: Yes    Comment: rarely   Drug use: Never   Sexual activity: Yes    Birth control/protection: None  Other Topics Concern   Not on  file  Social History Narrative   Not on file   Social Determinants of Health   Financial Resource Strain: Not on file  Food Insecurity: Not on file  Transportation Needs: Not on file  Physical Activity: Not on file  Stress: Not on file  Social Connections: Not on file  Intimate Partner Violence: Not on file   Family Status  Relation Name Status   Mother  Deceased       cause of death Lymphoma   Father  Deceased   Brother  (Not Specified)       pacemaker, alcoholic,drug addict, depression, mental illness   MGM  Deceased at age 55       Cause of death Thyroid cancer   Family History  Problem Relation Age of Onset   Alcohol abuse Father    Allergies  Allergen Reactions   Bee Venom Anaphylaxis   Iodine Anaphylaxis    On 02/17/20 patient tells me that he had upper extremity and facial swelling after topical iodine. LP   Metformin And Related     Stomach pain, cramping, dehydration when dose was increased to 1000mg  twice a day in 2018    Patient Care Team: Malva Limes, MD  as PCP - General (Family Medicine) Dayna BarkerBehalal-Bock, Christele, MD as Referring Physician (Rheumatology)   Medications: Outpatient Medications Prior to Visit  Medication Sig   furosemide (LASIX) 20 MG tablet TAKE 1 TABLET (20 MG TOTAL) BY MOUTH DAILY. FOR SWELLING   lisinopril-hydrochlorothiazide (ZESTORETIC) 10-12.5 MG tablet TAKE 1 TABLET BY MOUTH EVERY DAY   allopurinol (ZYLOPRIM) 300 MG tablet Take 1 tablet (300 mg total) by mouth daily. (Patient not taking: Reported on 05/28/2022)   colchicine 0.6 MG tablet Take 1 tablet (0.6 mg total) by mouth daily as needed (gout). (Patient not taking: Reported on 05/28/2022)   lovastatin (MEVACOR) 40 MG tablet Take 1 tablet (40 mg total) by mouth daily. (Patient not taking: Reported on 05/28/2022)   No facility-administered medications prior to visit.    Review of Systems  Constitutional: Negative.   HENT:  Positive for tinnitus.   Eyes: Negative.   Respiratory:  Negative.    Cardiovascular:  Positive for leg swelling.  Gastrointestinal: Negative.   Endocrine: Negative.   Genitourinary: Negative.   Musculoskeletal: Negative.   Skin: Negative.   Allergic/Immunologic: Negative.   Neurological: Negative.   Hematological: Negative.   Psychiatric/Behavioral: Negative.         Objective    BP 132/85 (BP Location: Left Arm, Patient Position: Sitting, Cuff Size: Normal)   Pulse 87   Temp (!) 97.5 F (36.4 C) (Oral)   Wt 210 lb (95.3 kg)   SpO2 96%   BMI 28.48 kg/m    Physical Exam   General Appearance:    Well developed, well nourished male. Alert, cooperative, in no acute distress, appears stated age  Head:    Normocephalic, without obvious abnormality, atraumatic  Eyes:    PERRL, conjunctiva/corneas clear, EOM's intact, fundi    benign, both eyes       Ears:    Normal TM's and external ear canals, both ears  Nose:   Nares normal, septum midline, mucosa normal, no drainage   or sinus tenderness  Throat:   Lips, mucosa, and tongue normal; teeth and gums normal  Neck:   Supple, symmetrical, trachea midline, no adenopathy;       thyroid:  No enlargement/tenderness/nodules; no carotid   bruit or JVD  Back:     Symmetric, no curvature, ROM normal, no CVA tenderness  Lungs:     Clear to auscultation bilaterally, respirations unlabored  Chest wall:    No tenderness or deformity  Heart:    Normal heart rate. Normal rhythm. No murmurs, rubs, or gallops.  S1 and S2 normal  Abdomen:     Soft, non-tender, bowel sounds active all four quadrants,    no masses, no organomegaly  Genitalia:    deferred  Rectal:    deferred  Extremities:   All extremities are intact. No cyanosis or edema  Pulses:   2+ and symmetric all extremities  Skin:   Skin color, texture, turgor normal, no rashes or lesions  Lymph nodes:   Cervical, supraclavicular, and axillary nodes normal  Neurologic:   CNII-XII intact. Normal strength, sensation and reflexes       throughout     Last depression screening scores    05/28/2022    2:38 PM 11/23/2019   11:18 AM 10/07/2017   10:26 AM  PHQ 2/9 Scores  PHQ - 2 Score 0 0 0  PHQ- 9 Score 0 0 0   Last fall risk screening    05/28/2022    2:38 PM  Fall Risk  Falls in the past year? 0  Number falls in past yr: 0  Injury with Fall? 0   Last Audit-C alcohol use screening    05/28/2022    2:38 PM  Alcohol Use Disorder Test (AUDIT)  1. How often do you have a drink containing alcohol? 1  2. How many drinks containing alcohol do you have on a typical day when you are drinking? 0  3. How often do you have six or more drinks on one occasion? 0  AUDIT-C Score 1   A score of 3 or more in women, and 4 or more in men indicates increased risk for alcohol abuse, EXCEPT if all of the points are from question 1   No results found for any visits on 05/28/22.  Assessment & Plan    Routine Health Maintenance and Physical Exam  Exercise Activities and Dietary recommendations  Goals   None     Immunization History  Administered Date(s) Administered   Tdap 02/19/2014    Health Maintenance  Topic Date Due   COVID-19 Vaccine (1) Never done   FOOT EXAM  Never done   OPHTHALMOLOGY EXAM  Never done   HIV Screening  Never done   Hepatitis C Screening  Never done   Zoster Vaccines- Shingrix (1 of 2) Never done   COLONOSCOPY (Pts 45-4390yrs Insurance coverage will need to be confirmed)  Never done   Lung Cancer Screening  Never done   Diabetic kidney evaluation - Urine ACR  10/08/2018   HEMOGLOBIN A1C  12/23/2020   Diabetic kidney evaluation - eGFR measurement  02/16/2021   INFLUENZA VACCINE  09/20/2022   DTaP/Tdap/Td (2 - Td or Tdap) 02/20/2024   HPV VACCINES  Aged Out    Discussed health benefits of physical activity, and encouraged him to engage in regular exercise appropriate for his age and condition.   - Comprehensive metabolic panel  2. Colon cancer screening  - Ambulatory referral to  gastroenterology for colonoscopy  3. Prostate cancer screening  - PSA  4. Macular degeneration of both eyes, unspecified type [facility] [place]  5. Type 2 diabetes mellitus without complication, without long-term current use of insulin  - Hemoglobin A1c  6. Smoking greater than 30 pack years Smoking cessation encouraged.   7. Hypertriglyceridemia  - Lipid panel  8. Primary hypertension Well controlled.  .  9. Chronic tophaceous gout refill - allopurinol (ZYLOPRIM) 300 MG tablet; Take 1 tablet (300 mg total) by mouth daily.  Dispense: 90 tablet; Refill: 1 - colchicine 0.6 MG tablet; Take 1 tablet (0.6 mg total) by mouth daily as needed (gout).  Dispense: 90 tablet; Refill: 2     The entirety of the information documented in the History of Present Illness, Review of Systems and Physical Exam were personally obtained by me. Portions of this information were initially documented by the CMA and reviewed by me for thoroughness and accuracy.     Mila Merryonald Soo Steelman, MD  [facility] [phone] (phone) [phone] (fax)  [facility]

## 2022-05-28 NOTE — Patient Instructions (Addendum)
Please review the attached list of medications and notify my office if there are any errors.   Please go to the lab draw station in [address] on the second floor of [facility]  when you are fasting for 8 hours. Normal hours are 8:00am to 11:30am and 1:00pm to 4:00pm Monday through Friday   Call [facility] at [phone] about the colonoscopy if you don't hear from them by the end of this week

## 2022-05-29 ENCOUNTER — Ambulatory Visit (INDEPENDENT_AMBULATORY_CARE_PROVIDER_SITE_OTHER): Payer: Self-pay | Admitting: Urology

## 2022-05-29 ENCOUNTER — Encounter: Payer: Self-pay | Admitting: Urology

## 2022-05-29 VITALS — BP 136/79 | HR 85 | Ht 72.0 in | Wt 210.0 lb

## 2022-05-29 DIAGNOSIS — N2889 Other specified disorders of kidney and ureter: Secondary | ICD-10-CM

## 2022-05-29 NOTE — Progress Notes (Signed)
   05/29/2022 2:23 PM   Howard Sanders 05-May-1965 [id number]  Reason for visit: Follow up left renal mass  HPI: 57 year old male I previously saw for a 1.2 cm indeterminate left renal mass in January 2022 and recommended a follow-up renal ultrasound, but he never followed up.  This was originally seen on a CT in June 2018 at [facility] showing a 1 cm indeterminate left renal exophytic lesion at the lower pole.  A follow-up CT for abdominal pain in December 2021 showed a 1.2 cm indeterminate lesion in the left lower kidney which may represent hemorrhagic cyst or solid renal mass.  He is now interested in getting further follow-up imaging regarding this lesion.  We discussed the differences between CT, MRI, and ultrasound.  We discussed MRI would be the best test for definitive diagnosis of this lesion.  He does not have insurance and does not think an MRI would be affordable, but he would be amenable to considering a renal ultrasound.  We discussed possible need to monitor this lesion moving forward pending ultrasound results  Renal ultrasound for surveillance/further evaluation of 1.2 cm left renal lesion, call with results   Sondra Come, MD  [facility] [address] [place], [place] [address] [phone]

## 2022-05-30 ENCOUNTER — Other Ambulatory Visit: Payer: Self-pay | Admitting: Family Medicine

## 2022-05-30 DIAGNOSIS — E781 Pure hyperglyceridemia: Secondary | ICD-10-CM

## 2022-05-30 LAB — LIPID PANEL
Chol/HDL Ratio: 6.7 ratio — ABNORMAL HIGH (ref 0.0–5.0)
Cholesterol, Total: 182 mg/dL (ref 100–199)
HDL: 27 mg/dL — ABNORMAL LOW (ref >39)
LDL Chol Calc (NIH): 120 mg/dL — ABNORMAL HIGH (ref 0–99)
Triglycerides: 197 mg/dL — ABNORMAL HIGH (ref 0–149)
VLDL Cholesterol Cal: 35 mg/dL (ref 5–40)

## 2022-05-30 LAB — COMPREHENSIVE METABOLIC PANEL
ALT: 10 IU/L (ref 0–44)
AST: 11 IU/L (ref 0–40)
Albumin/Globulin Ratio: 1.5 (ref 1.2–2.2)
Albumin: 4 g/dL (ref 3.8–4.9)
Alkaline Phosphatase: 122 IU/L — ABNORMAL HIGH (ref 44–121)
BUN/Creatinine Ratio: 21 — ABNORMAL HIGH (ref 9–20)
BUN: 23 mg/dL (ref 6–24)
Bilirubin Total: 0.5 mg/dL (ref 0.0–1.2)
CO2: 23 mmol/L (ref 20–29)
Calcium: 9.3 mg/dL (ref 8.7–10.2)
Chloride: 102 mmol/L (ref 96–106)
Creatinine, Ser: 1.12 mg/dL (ref 0.76–1.27)
Globulin, Total: 2.7 g/dL (ref 1.5–4.5)
Glucose: 135 mg/dL — ABNORMAL HIGH (ref 70–99)
Potassium: 4.6 mmol/L (ref 3.5–5.2)
Sodium: 141 mmol/L (ref 134–144)
Total Protein: 6.7 g/dL (ref 6.0–8.5)
eGFR: 77 mL/min/{1.73_m2} (ref >59)

## 2022-05-30 LAB — HEMOGLOBIN A1C
Est. average glucose Bld gHb Est-mCnc: 154 mg/dL
Hgb A1c MFr Bld: 7 % — ABNORMAL HIGH (ref 4.8–5.6)

## 2022-05-30 LAB — PSA: Prostate Specific Ag, Serum: 0.9 ng/mL (ref 0.0–4.0)

## 2022-05-30 MED ORDER — LOVASTATIN 40 MG PO TABS
40.0000 mg | ORAL_TABLET | Freq: Every day | ORAL | 3 refills | Status: AC
Start: 2022-05-30 — End: 2023-05-25

## 2022-05-31 ENCOUNTER — Telehealth: Payer: Self-pay

## 2022-05-31 ENCOUNTER — Ambulatory Visit: Payer: Self-pay

## 2022-05-31 ENCOUNTER — Other Ambulatory Visit: Payer: Self-pay

## 2022-05-31 DIAGNOSIS — Z1211 Encounter for screening for malignant neoplasm of colon: Secondary | ICD-10-CM

## 2022-05-31 MED ORDER — NA SULFATE-K SULFATE-MG SULF 17.5-3.13-1.6 GM/177ML PO SOLN: 1.0000 | Freq: Once | ORAL | 0 refills | Status: AC

## 2022-05-31 NOTE — Telephone Encounter (Signed)
Gastroenterology Pre-Procedure Review  Request Date: 10/04/22 Requesting Physician: Dr. Servando Snare  PATIENT REVIEW QUESTIONS: The patient responded to the following health history questions as indicated:    1. Are you having any GI issues? no 2. Do you have a personal history of Polyps? no 3. Do you have a family history of Colon Cancer or Polyps? no 4. Diabetes Mellitus? no 5. Joint replacements in the past 12 months?no 6. Major health problems in the past 3 months?no 7. Any artificial heart valves, MVP, or defibrillator?no    MEDICATIONS & ALLERGIES:    Patient reports the following regarding taking any anticoagulation/antiplatelet therapy:   Plavix, Coumadin, Eliquis, Xarelto, Lovenox, Pradaxa, Brilinta, or Effient? no Aspirin? no  Patient confirms/reports the following medications:  Current Outpatient Medications  Medication Sig Dispense Refill   allopurinol (ZYLOPRIM) 300 MG tablet Take 1 tablet (300 mg total) by mouth daily. 90 tablet 1   colchicine 0.6 MG tablet Take 1 tablet (0.6 mg total) by mouth daily as needed (gout). 90 tablet 2   furosemide (LASIX) 20 MG tablet TAKE 1 TABLET (20 MG TOTAL) BY MOUTH DAILY. FOR SWELLING 90 tablet 0   lisinopril-hydrochlorothiazide (ZESTORETIC) 10-12.5 MG tablet TAKE 1 TABLET BY MOUTH EVERY DAY 90 tablet 0   lovastatin (MEVACOR) 40 MG tablet Take 1 tablet (40 mg total) by mouth daily. 90 tablet 3   No current facility-administered medications for this visit.    Patient confirms/reports the following allergies:  Allergies  Allergen Reactions   Bee Venom Anaphylaxis   Iodine Anaphylaxis    On 02/17/20 patient tells me that he had upper extremity and facial swelling after topical iodine. LP   Metformin And Related     Stomach pain, cramping, dehydration when dose was increased to 1000mg  twice a day in 2018    No orders of the defined types were placed in this encounter.   AUTHORIZATION INFORMATION Primary Insurance: 1D#: Group  #:  Secondary Insurance: 1D#: Group #:  SCHEDULE INFORMATION: Date: 10/04/22 Time: Location: [facility]

## 2022-06-04 DIAGNOSIS — H9313 Tinnitus, bilateral: Secondary | ICD-10-CM | POA: Insufficient documentation

## 2022-08-01 ENCOUNTER — Ambulatory Visit: Payer: Self-pay

## 2022-08-01 NOTE — Telephone Encounter (Signed)
  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 Pt preferred appt next week since he has a delivery this week in [place]. Scheduled for 08/07/22 at 0800 with [place], [place]. Advised pt to continue medication until seen since appt is several days out and to call back if any alarming sx develop. Pt verbalized understanding.   Summary: Pt experiecing pain in several areas   Pt spouse reports that the pt is experiencing pain in several areas and it may be caused by the lovastatin (MEVACOR) 40 MG tablet. Pt was not with his spouse at the time of call and pt spouse asked that he be contacted to discuss. Cb# [phone]       Reason for Disposition  [1] MILD or MODERATE muscle aches or pain AND [2] taking a statin medicine (a lipid or cholesterol lowering drug)  Answer Assessment - Initial Assessment Questions 1. NAME of MEDICINE: "What medicine(s) are you calling about?"     lovastatin 2. QUESTION: "What is your question?" (e.g., double dose of medicine, side effect)     Having SE from medication 3. PRESCRIBER: "Who prescribed the medicine?" Reason: if prescribed by specialist, call should be referred to that group.     Dr. Sherrie Mustache  4. SYMPTOMS: "Do you have any symptoms?" If Yes, ask: "What symptoms are you having?"  "How bad are the symptoms (e.g., mild, moderate, severe)     Leg pain, joint pain, back pain, neck pain x 1 month  Protocols used: Muscle Aches and Body Pain-A-AH

## 2022-08-07 NOTE — Progress Notes (Unsigned)
     I,Prateek Knipple,acting as a Neurosurgeon for [employer], PA-C.,have documented all relevant documentation on the behalf of Alfredia Ferguson, PA-C,as directed by  Alfredia Ferguson, PA-C while in the presence of Alfredia Ferguson, PA-C.   Established patient visit   Patient: Howard Sanders   DOB: 1965-04-03   57 y.o. Male  MRN: [id number] Visit Date: 08/08/2022  Today's healthcare provider: Alfredia Ferguson, PA-C   No chief complaint on file.  Subjective    HPI  ***  Medications: Outpatient Medications Prior to Visit  Medication Sig   allopurinol (ZYLOPRIM) 300 MG tablet Take 1 tablet (300 mg total) by mouth daily.   colchicine 0.6 MG tablet Take 1 tablet (0.6 mg total) by mouth daily as needed (gout).   furosemide (LASIX) 20 MG tablet TAKE 1 TABLET (20 MG TOTAL) BY MOUTH DAILY. FOR SWELLING   lisinopril-hydrochlorothiazide (ZESTORETIC) 10-12.5 MG tablet TAKE 1 TABLET BY MOUTH EVERY DAY   lovastatin (MEVACOR) 40 MG tablet Take 1 tablet (40 mg total) by mouth daily.   No facility-administered medications prior to visit.    Review of Systems  {Labs  Heme  Chem  Endocrine  Serology  Results Review (optional):23779}   Objective    There were no vitals taken for this visit. {Show previous Ryver Zadrozny signs (optional):23777}  Physical Exam  ***  No results found for any visits on 08/08/22.  Assessment & Plan     ***  No follow-ups on file.      {provider attestation***:1}   Alfredia Ferguson, PA-C  [facility] [phone] (phone) [phone] (fax)  [facility]

## 2022-08-08 ENCOUNTER — Encounter: Payer: Self-pay | Admitting: Physician Assistant

## 2022-08-08 ENCOUNTER — Ambulatory Visit (INDEPENDENT_AMBULATORY_CARE_PROVIDER_SITE_OTHER): Payer: Self-pay | Admitting: Physician Assistant

## 2022-08-08 VITALS — BP 151/98 | HR 88 | Temp 98.0°F | Resp 14 | Ht 72.0 in | Wt 206.6 lb

## 2022-08-08 DIAGNOSIS — M791 Myalgia, unspecified site: Secondary | ICD-10-CM

## 2022-08-20 ENCOUNTER — Other Ambulatory Visit: Payer: Self-pay | Admitting: Family Medicine

## 2022-08-20 DIAGNOSIS — I10 Essential (primary) hypertension: Secondary | ICD-10-CM

## 2022-09-27 ENCOUNTER — Encounter: Payer: Self-pay | Admitting: Gastroenterology

## 2022-10-01 ENCOUNTER — Telehealth: Payer: Self-pay

## 2022-10-01 NOTE — Telephone Encounter (Signed)
Phone call returned to patients fincee to let her know her message was received and I've canceled her husbands colonoscopy with Dr. Servando Snare on 10/04/22.  Thanks,  [place], [place]

## 2022-10-01 NOTE — Telephone Encounter (Signed)
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 She states they will call back to reschedule when they know he will be in town

## 2022-10-04 ENCOUNTER — Ambulatory Visit: Admit: 2022-10-04 | Payer: Self-pay | Admitting: Gastroenterology

## 2022-10-04 SURGERY — COLONOSCOPY WITH PROPOFOL
Anesthesia: General

## 2022-11-21 ENCOUNTER — Other Ambulatory Visit: Payer: Self-pay | Admitting: Family Medicine

## 2022-11-21 DIAGNOSIS — I10 Essential (primary) hypertension: Secondary | ICD-10-CM

## 2022-11-21 NOTE — Telephone Encounter (Signed)
Requested Prescriptions  Pending Prescriptions Disp Refills   lisinopril-hydrochlorothiazide (ZESTORETIC) 10-12.5 MG tablet [Pharmacy Med Name: LISINOPRIL-HCTZ 10-12.5 MG TAB] 90 tablet 0    Sig: TAKE 1 TABLET BY MOUTH EVERY DAY     Cardiovascular:  ACEI + Diuretic Combos Failed - 11/21/2022  2:20 AM      Failed - Last BP in normal range    BP Readings from Last 1 Encounters:  08/08/22 (!) 151/98         Passed - Na in normal range and within 180 days    Sodium  Date Value Ref Range Status  05/29/2022 141 134 - 144 mmol/L Final  09/09/2011 142 136 - 145 mmol/L Final         Passed - K in normal range and within 180 days    Potassium  Date Value Ref Range Status  05/29/2022 4.6 3.5 - 5.2 mmol/L Final  09/09/2011 3.8 3.5 - 5.1 mmol/L Final         Passed - Cr in normal range and within 180 days    Creatinine  Date Value Ref Range Status  09/09/2011 0.97 0.60 - 1.30 mg/dL Final   Creatinine, Ser  Date Value Ref Range Status  05/29/2022 1.12 0.76 - 1.27 mg/dL Final         Passed - eGFR is 30 or above and within 180 days    EGFR (African American)  Date Value Ref Range Status  09/09/2011 >60  Final   GFR calc Af Amer  Date Value Ref Range Status  11/21/2018 119 >59 mL/min/1.73 Final   EGFR (Non-African Amer.)  Date Value Ref Range Status  09/09/2011 >60  Final    Comment:    eGFR values <20mL/min/1.73 m2 may be an indication of chronic kidney disease (CKD). Calculated eGFR is useful in patients with stable renal function. The eGFR calculation will not be reliable in acutely ill patients when serum creatinine is changing rapidly. It is not useful in  patients on dialysis. The eGFR calculation may not be applicable to patients at the low and high extremes of body sizes, pregnant women, and vegetarians.    GFR, Estimated  Date Value Ref Range Status  02/17/2020 >60 >60 mL/min Final    Comment:    (NOTE) Calculated using the CKD-EPI Creatinine Equation (2021)     eGFR  Date Value Ref Range Status  05/29/2022 77 >59 mL/min/1.73 Final         Passed - Patient is not pregnant      Passed - Valid encounter within last 6 months    Recent Outpatient Visits           3 months ago Myalgia   [facility] Alfredia Ferguson, PA-C   5 months ago Annual physical exam   [facility] Malva Limes, MD   2 years ago Type 2 diabetes mellitus without complication, without long-term current use of insulin (HCC)   [facility] Malva Limes, MD   2 years ago Type 2 diabetes mellitus without complication, without long-term current use of insulin (HCC)   [facility] Malva Limes, MD   3 years ago Type 2 diabetes mellitus without complication, without long-term current use of insulin (HCC)   [place] Sherrie Mustache, Demetrios Isaacs, MD               furosemide (LASIX) 20 MG tablet [Pharmacy Med Name:  FUROSEMIDE 20 MG TABLET] 90 tablet 0    Sig: TAKE 1 TABLET (20 MG TOTAL) BY MOUTH DAILY. FOR SWELLING     Cardiovascular:  Diuretics - Loop Failed - 11/21/2022  2:20 AM      Failed - Mg Level in normal range and within 180 days    No results found for: "MG"       Failed - Last BP in normal range    BP Readings from Last 1 Encounters:  08/08/22 (!) 151/98         Passed - K in normal range and within 180 days    Potassium  Date Value Ref Range Status  05/29/2022 4.6 3.5 - 5.2 mmol/L Final  09/09/2011 3.8 3.5 - 5.1 mmol/L Final         Passed - Ca in normal range and within 180 days    Calcium  Date Value Ref Range Status  05/29/2022 9.3 8.7 - 10.2 mg/dL Final   Calcium, Total  Date Value Ref Range Status  09/09/2011 8.6 8.5 - 10.1 mg/dL Final         Passed - Na in normal range and within 180 days    Sodium  Date Value Ref Range Status  05/29/2022 141 134 - 144 mmol/L Final  09/09/2011 142 136 - 145 mmol/L Final          Passed - Cr in normal range and within 180 days    Creatinine  Date Value Ref Range Status  09/09/2011 0.97 0.60 - 1.30 mg/dL Final   Creatinine, Ser  Date Value Ref Range Status  05/29/2022 1.12 0.76 - 1.27 mg/dL Final         Passed - Cl in normal range and within 180 days    Chloride  Date Value Ref Range Status  05/29/2022 102 96 - 106 mmol/L Final  09/09/2011 107 98 - 107 mmol/L Final         Passed - Valid encounter within last 6 months    Recent Outpatient Visits           3 months ago Myalgia   [facility] Alfredia Ferguson, PA-C   5 months ago Annual physical exam   [facility] Malva Limes, MD   2 years ago Type 2 diabetes mellitus without complication, without long-term current use of insulin (HCC)   [place] [facility] Malva Limes, MD   2 years ago Type 2 diabetes mellitus without complication, without long-term current use of insulin (HCC)   [place] [facility] Malva Limes, MD   3 years ago Type 2 diabetes mellitus without complication, without long-term current use of insulin (HCC)   [facility] Sherrie Mustache, Demetrios Isaacs, MD

## 2023-07-07 ENCOUNTER — Other Ambulatory Visit: Payer: Self-pay | Admitting: Family Medicine

## 2023-07-07 DIAGNOSIS — M1A9XX1 Chronic gout, unspecified, with tophus (tophi): Secondary | ICD-10-CM

## 2023-07-08 ENCOUNTER — Other Ambulatory Visit: Payer: Self-pay | Admitting: Family Medicine

## 2023-07-08 DIAGNOSIS — I10 Essential (primary) hypertension: Secondary | ICD-10-CM

## 2023-07-16 ENCOUNTER — Other Ambulatory Visit: Payer: Self-pay | Admitting: Family Medicine

## 2023-07-16 DIAGNOSIS — M1A9XX1 Chronic gout, unspecified, with tophus (tophi): Secondary | ICD-10-CM

## 2023-07-16 DIAGNOSIS — I10 Essential (primary) hypertension: Secondary | ICD-10-CM

## 2023-07-16 NOTE — Telephone Encounter (Unsigned)
 Copied from CRM [id number]. Topic: Clinical - Medication Refill >> Jul 16, 2023  2:36 PM Lynnie Saucier S wrote: Medication: allopurinol  (ZYLOPRIM ) 300 MG tablet  colchicine  0.6 MG tablet  furosemide  (LASIX ) 20 MG tablet lisinopril -hydrochlorothiazide  (ZESTORETIC ) 10-12.5 MG tablet  Has the patient contacted their pharmacy? Yes (Agent: If no, request that the patient contact the pharmacy for the refill. If patient does not wish to contact the pharmacy document the reason why and proceed with request.) (Agent: If yes, when and what did the pharmacy advise?)  This is the patient's preferred pharmacy:  [id number] [place], [place] - [address] [place] [address] Phone: [phone] Fax: [phone]  Is this the correct pharmacy for this prescription? Yes If no, delete pharmacy and type the correct one.   Has the prescription been filled recently? No  Is the patient out of the medication? Yes  Has the patient been seen for an appointment in the last year OR does the patient have an upcoming appointment? Yes  Can we respond through MyChart? Yes  Agent: Please be advised that Rx refills may take up to 3 business days. We ask that you follow-up with your pharmacy.

## 2023-07-19 ENCOUNTER — Other Ambulatory Visit: Payer: Self-pay

## 2023-07-19 ENCOUNTER — Telehealth: Payer: Self-pay

## 2023-07-19 ENCOUNTER — Telehealth: Payer: Self-pay | Admitting: Family Medicine

## 2023-07-19 DIAGNOSIS — M1A9XX1 Chronic gout, unspecified, with tophus (tophi): Secondary | ICD-10-CM

## 2023-07-19 DIAGNOSIS — I10 Essential (primary) hypertension: Secondary | ICD-10-CM

## 2023-07-19 MED ORDER — FUROSEMIDE 20 MG PO TABS: 20.0000 mg | ORAL_TABLET | Freq: Every day | ORAL | 0 refills | Status: DC

## 2023-07-19 MED ORDER — LISINOPRIL-HYDROCHLOROTHIAZIDE 10-12.5 MG PO TABS
1.0000 | ORAL_TABLET | Freq: Every day | ORAL | 0 refills | Status: AC
Start: 2023-07-19 — End: ?

## 2023-07-19 MED ORDER — COLCHICINE 0.6 MG PO TABS: 0.6000 mg | ORAL_TABLET | Freq: Every day | ORAL | 2 refills | Status: AC | PRN

## 2023-07-19 MED ORDER — ALLOPURINOL 300 MG PO TABS: 300.0000 mg | ORAL_TABLET | Freq: Every day | ORAL | 0 refills | Status: DC

## 2023-07-19 NOTE — Telephone Encounter (Signed)
 Requested medications are due for refill today.  yes  Requested medications are on the active medications list.  yes  Last refill. varied  Future visit scheduled.   yes  Notes to clinic.  Labs are expired.    Requested Prescriptions  Pending Prescriptions Disp Refills   colchicine  0.6 MG tablet 90 tablet 2    Sig: Take 1 tablet (0.6 mg total) by mouth daily as needed (gout).     Endocrinology:  Gout Agents - colchicine  Failed - 07/19/2023  8:45 AM      Failed - Cr in normal range and within 360 days    Creatinine  Date Value Ref Range Status  09/09/2011 0.97 0.60 - 1.30 mg/dL Final   Creatinine, Ser  Date Value Ref Range Status  05/29/2022 1.12 0.76 - 1.27 mg/dL Final         Failed - ALT in normal range and within 360 days    ALT  Date Value Ref Range Status  05/29/2022 10 0 - 44 IU/L Final   SGPT (ALT)  Date Value Ref Range Status  09/09/2011 49 U/L Final    Comment:    12-78 NOTE: NEW REFERENCE RANGE 01/12/2011          Failed - AST in normal range and within 360 days    AST  Date Value Ref Range Status  05/29/2022 11 0 - 40 IU/L Final   SGOT(AST)  Date Value Ref Range Status  09/09/2011 29 15 - 37 Unit/L Final         Failed - Valid encounter within last 12 months    Recent Outpatient Visits   None            Failed - CBC within normal limits and completed in the last 12 months    WBC  Date Value Ref Range Status  02/17/2020 13.3 (H) 4.0 - 10.5 K/uL Final   RBC  Date Value Ref Range Status  02/17/2020 5.13 4.22 - 5.81 MIL/uL Final   Hemoglobin  Date Value Ref Range Status  02/17/2020 15.9 13.0 - 17.0 g/dL Final  16/11/9602 54.0 13.0 - 17.7 g/dL Final   HCT  Date Value Ref Range Status  02/17/2020 45.1 39.0 - 52.0 % Final   Hematocrit  Date Value Ref Range Status  05/25/2019 45.8 37.5 - 51.0 % Final   MCHC  Date Value Ref Range Status  02/17/2020 35.3 30.0 - 36.0 g/dL Final   [facility]  Date Value Ref Range Status  02/17/2020 31.0  26.0 - 34.0 pg Final   MCV  Date Value Ref Range Status  02/17/2020 87.9 80.0 - 100.0 fL Final  05/25/2019 91 79 - 97 fL Final  09/09/2011 89 80 - 100 fL Final   No results found for: "PLTCOUNTKUC", "LABPLAT", "POCPLA" RDW  Date Value Ref Range Status  02/17/2020 13.2 11.5 - 15.5 % Final  05/25/2019 14.0 11.6 - 15.4 % Final  09/09/2011 13.7 11.5 - 14.5 % Final          furosemide  (LASIX ) 20 MG tablet 90 tablet 0    Sig: Take 1 tablet (20 mg total) by mouth daily. For swelling     Cardiovascular:  Diuretics - Loop Failed - 07/19/2023  8:45 AM      Failed - K in normal range and within 180 days    Potassium  Date Value Ref Range Status  05/29/2022 4.6 3.5 - 5.2 mmol/L Final  09/09/2011 3.8 3.5 - 5.1 mmol/L Final  Failed - Ca in normal range and within 180 days    Calcium   Date Value Ref Range Status  05/29/2022 9.3 8.7 - 10.2 mg/dL Final   Calcium , Total  Date Value Ref Range Status  09/09/2011 8.6 8.5 - 10.1 mg/dL Final         Failed - Na in normal range and within 180 days    Sodium  Date Value Ref Range Status  05/29/2022 141 134 - 144 mmol/L Final  09/09/2011 142 136 - 145 mmol/L Final         Failed - Cr in normal range and within 180 days    Creatinine  Date Value Ref Range Status  09/09/2011 0.97 0.60 - 1.30 mg/dL Final   Creatinine, Ser  Date Value Ref Range Status  05/29/2022 1.12 0.76 - 1.27 mg/dL Final         Failed - Cl in normal range and within 180 days    Chloride  Date Value Ref Range Status  05/29/2022 102 96 - 106 mmol/L Final  09/09/2011 107 98 - 107 mmol/L Final         Failed - Mg Level in normal range and within 180 days    No results found for: "MG"       Failed - Last BP in normal range    BP Readings from Last 1 Encounters:  08/08/22 (!) 151/98         Failed - Valid encounter within last 6 months    Recent Outpatient Visits   None             lisinopril -hydrochlorothiazide  (ZESTORETIC ) 10-12.5 MG tablet  90 tablet 0    Sig: Take 1 tablet by mouth daily.     Cardiovascular:  ACEI + Diuretic Combos Failed - 07/19/2023  8:45 AM      Failed - Na in normal range and within 180 days    Sodium  Date Value Ref Range Status  05/29/2022 141 134 - 144 mmol/L Final  09/09/2011 142 136 - 145 mmol/L Final         Failed - K in normal range and within 180 days    Potassium  Date Value Ref Range Status  05/29/2022 4.6 3.5 - 5.2 mmol/L Final  09/09/2011 3.8 3.5 - 5.1 mmol/L Final         Failed - Cr in normal range and within 180 days    Creatinine  Date Value Ref Range Status  09/09/2011 0.97 0.60 - 1.30 mg/dL Final   Creatinine, Ser  Date Value Ref Range Status  05/29/2022 1.12 0.76 - 1.27 mg/dL Final         Failed - eGFR is 30 or above and within 180 days    EGFR (African American)  Date Value Ref Range Status  09/09/2011 >60  Final   GFR calc Af Amer  Date Value Ref Range Status  11/21/2018 119 >59 mL/min/1.73 Final   EGFR (Non-African Amer.)  Date Value Ref Range Status  09/09/2011 >60  Final    Comment:    eGFR values <80mL/min/1.73 m2 may be an indication of chronic kidney disease (CKD). Calculated eGFR is useful in patients with stable renal function. The eGFR calculation will not be reliable in acutely ill patients when serum creatinine is changing rapidly. It is not useful in  patients on dialysis. The eGFR calculation may not be applicable to patients at the low and high extremes of body sizes, pregnant women, and vegetarians.  GFR, Estimated  Date Value Ref Range Status  02/17/2020 >60 >60 mL/min Final    Comment:    (NOTE) Calculated using the CKD-EPI Creatinine Equation (2021)    eGFR  Date Value Ref Range Status  05/29/2022 77 >59 mL/min/1.73 Final         Failed - Last BP in normal range    BP Readings from Last 1 Encounters:  08/08/22 (!) 151/98         Failed - Valid encounter within last 6 months    Recent Outpatient Visits   None             Passed - Patient is not pregnant      Refused Prescriptions Disp Refills   allopurinol  (ZYLOPRIM ) 300 MG tablet 90 tablet 0    Sig: Take 1 tablet (300 mg total) by mouth daily.     Endocrinology:  Gout Agents - allopurinol  Failed - 07/19/2023  8:45 AM      Failed - Uric Acid in normal range and within 360 days    Uric Acid  Date Value Ref Range Status  11/21/2018 7.2 3.7 - 8.6 mg/dL Final    Comment:               Therapeutic target for gout patients: <6.0         Failed - Cr in normal range and within 360 days    Creatinine  Date Value Ref Range Status  09/09/2011 0.97 0.60 - 1.30 mg/dL Final   Creatinine, Ser  Date Value Ref Range Status  05/29/2022 1.12 0.76 - 1.27 mg/dL Final         Failed - Valid encounter within last 12 months    Recent Outpatient Visits   None            Failed - CBC within normal limits and completed in the last 12 months    WBC  Date Value Ref Range Status  02/17/2020 13.3 (H) 4.0 - 10.5 K/uL Final   RBC  Date Value Ref Range Status  02/17/2020 5.13 4.22 - 5.81 MIL/uL Final   Hemoglobin  Date Value Ref Range Status  02/17/2020 15.9 13.0 - 17.0 g/dL Final  40/98/1191 47.8 13.0 - 17.7 g/dL Final   HCT  Date Value Ref Range Status  02/17/2020 45.1 39.0 - 52.0 % Final   Hematocrit  Date Value Ref Range Status  05/25/2019 45.8 37.5 - 51.0 % Final   MCHC  Date Value Ref Range Status  02/17/2020 35.3 30.0 - 36.0 g/dL Final   [facility]  Date Value Ref Range Status  02/17/2020 31.0 26.0 - 34.0 pg Final   MCV  Date Value Ref Range Status  02/17/2020 87.9 80.0 - 100.0 fL Final  05/25/2019 91 79 - 97 fL Final  09/09/2011 89 80 - 100 fL Final   No results found for: "PLTCOUNTKUC", "LABPLAT", "POCPLA" RDW  Date Value Ref Range Status  02/17/2020 13.2 11.5 - 15.5 % Final  05/25/2019 14.0 11.6 - 15.4 % Final  09/09/2011 13.7 11.5 - 14.5 % Final

## 2023-07-19 NOTE — Telephone Encounter (Signed)
 Left message that provider will be out of office 6/23 so appt has been cancelled and needs to be reschedule. If patient calls back it is ok to schedule

## 2023-07-19 NOTE — Telephone Encounter (Signed)
 Requested Prescriptions  Pending Prescriptions Disp Refills   allopurinol  (ZYLOPRIM ) 300 MG tablet 90 tablet 0    Sig: Take 1 tablet (300 mg total) by mouth daily.     Endocrinology:  Gout Agents - allopurinol  Failed - 07/19/2023  8:45 AM      Failed - Uric Acid in normal range and within 360 days    Uric Acid  Date Value Ref Range Status  11/21/2018 7.2 3.7 - 8.6 mg/dL Final    Comment:               Therapeutic target for gout patients: <6.0         Failed - Cr in normal range and within 360 days    Creatinine  Date Value Ref Range Status  09/09/2011 0.97 0.60 - 1.30 mg/dL Final   Creatinine, Ser  Date Value Ref Range Status  05/29/2022 1.12 0.76 - 1.27 mg/dL Final         Failed - Valid encounter within last 12 months    Recent Outpatient Visits   None            Failed - CBC within normal limits and completed in the last 12 months    WBC  Date Value Ref Range Status  02/17/2020 13.3 (H) 4.0 - 10.5 K/uL Final   RBC  Date Value Ref Range Status  02/17/2020 5.13 4.22 - 5.81 MIL/uL Final   Hemoglobin  Date Value Ref Range Status  02/17/2020 15.9 13.0 - 17.0 g/dL Final  04/54/0981 19.1 13.0 - 17.7 g/dL Final   HCT  Date Value Ref Range Status  02/17/2020 45.1 39.0 - 52.0 % Final   Hematocrit  Date Value Ref Range Status  05/25/2019 45.8 37.5 - 51.0 % Final   MCHC  Date Value Ref Range Status  02/17/2020 35.3 30.0 - 36.0 g/dL Final   [facility]  Date Value Ref Range Status  02/17/2020 31.0 26.0 - 34.0 pg Final   MCV  Date Value Ref Range Status  02/17/2020 87.9 80.0 - 100.0 fL Final  05/25/2019 91 79 - 97 fL Final  09/09/2011 89 80 - 100 fL Final   No results found for: "PLTCOUNTKUC", "LABPLAT", "POCPLA" RDW  Date Value Ref Range Status  02/17/2020 13.2 11.5 - 15.5 % Final  05/25/2019 14.0 11.6 - 15.4 % Final  09/09/2011 13.7 11.5 - 14.5 % Final          colchicine  0.6 MG tablet 90 tablet 2    Sig: Take 1 tablet (0.6 mg total) by mouth daily as  needed (gout).     Endocrinology:  Gout Agents - colchicine  Failed - 07/19/2023  8:45 AM      Failed - Cr in normal range and within 360 days    Creatinine  Date Value Ref Range Status  09/09/2011 0.97 0.60 - 1.30 mg/dL Final   Creatinine, Ser  Date Value Ref Range Status  05/29/2022 1.12 0.76 - 1.27 mg/dL Final         Failed - ALT in normal range and within 360 days    ALT  Date Value Ref Range Status  05/29/2022 10 0 - 44 IU/L Final   SGPT (ALT)  Date Value Ref Range Status  09/09/2011 49 U/L Final    Comment:    12-78 NOTE: NEW REFERENCE RANGE 01/12/2011          Failed - AST in normal range and within 360 days    AST  Date Value  Ref Range Status  05/29/2022 11 0 - 40 IU/L Final   SGOT(AST)  Date Value Ref Range Status  09/09/2011 29 15 - 37 Unit/L Final         Failed - Valid encounter within last 12 months    Recent Outpatient Visits   None            Failed - CBC within normal limits and completed in the last 12 months    WBC  Date Value Ref Range Status  02/17/2020 13.3 (H) 4.0 - 10.5 K/uL Final   RBC  Date Value Ref Range Status  02/17/2020 5.13 4.22 - 5.81 MIL/uL Final   Hemoglobin  Date Value Ref Range Status  02/17/2020 15.9 13.0 - 17.0 g/dL Final  91/47/8295 62.1 13.0 - 17.7 g/dL Final   HCT  Date Value Ref Range Status  02/17/2020 45.1 39.0 - 52.0 % Final   Hematocrit  Date Value Ref Range Status  05/25/2019 45.8 37.5 - 51.0 % Final   MCHC  Date Value Ref Range Status  02/17/2020 35.3 30.0 - 36.0 g/dL Final   [facility]  Date Value Ref Range Status  02/17/2020 31.0 26.0 - 34.0 pg Final   MCV  Date Value Ref Range Status  02/17/2020 87.9 80.0 - 100.0 fL Final  05/25/2019 91 79 - 97 fL Final  09/09/2011 89 80 - 100 fL Final   No results found for: "PLTCOUNTKUC", "LABPLAT", "POCPLA" RDW  Date Value Ref Range Status  02/17/2020 13.2 11.5 - 15.5 % Final  05/25/2019 14.0 11.6 - 15.4 % Final  09/09/2011 13.7 11.5 - 14.5 % Final           furosemide  (LASIX ) 20 MG tablet 90 tablet 0    Sig: Take 1 tablet (20 mg total) by mouth daily. For swelling     Cardiovascular:  Diuretics - Loop Failed - 07/19/2023  8:45 AM      Failed - K in normal range and within 180 days    Potassium  Date Value Ref Range Status  05/29/2022 4.6 3.5 - 5.2 mmol/L Final  09/09/2011 3.8 3.5 - 5.1 mmol/L Final         Failed - Ca in normal range and within 180 days    Calcium   Date Value Ref Range Status  05/29/2022 9.3 8.7 - 10.2 mg/dL Final   Calcium , Total  Date Value Ref Range Status  09/09/2011 8.6 8.5 - 10.1 mg/dL Final         Failed - Na in normal range and within 180 days    Sodium  Date Value Ref Range Status  05/29/2022 141 134 - 144 mmol/L Final  09/09/2011 142 136 - 145 mmol/L Final         Failed - Cr in normal range and within 180 days    Creatinine  Date Value Ref Range Status  09/09/2011 0.97 0.60 - 1.30 mg/dL Final   Creatinine, Ser  Date Value Ref Range Status  05/29/2022 1.12 0.76 - 1.27 mg/dL Final         Failed - Cl in normal range and within 180 days    Chloride  Date Value Ref Range Status  05/29/2022 102 96 - 106 mmol/L Final  09/09/2011 107 98 - 107 mmol/L Final         Failed - Mg Level in normal range and within 180 days    No results found for: "MG"       Failed - Last  BP in normal range    BP Readings from Last 1 Encounters:  08/08/22 (!) 151/98         Failed - Valid encounter within last 6 months    Recent Outpatient Visits   None             lisinopril -hydrochlorothiazide  (ZESTORETIC ) 10-12.5 MG tablet 90 tablet 0    Sig: Take 1 tablet by mouth daily.     Cardiovascular:  ACEI + Diuretic Combos Failed - 07/19/2023  8:45 AM      Failed - Na in normal range and within 180 days    Sodium  Date Value Ref Range Status  05/29/2022 141 134 - 144 mmol/L Final  09/09/2011 142 136 - 145 mmol/L Final         Failed - K in normal range and within 180 days    Potassium   Date Value Ref Range Status  05/29/2022 4.6 3.5 - 5.2 mmol/L Final  09/09/2011 3.8 3.5 - 5.1 mmol/L Final         Failed - Cr in normal range and within 180 days    Creatinine  Date Value Ref Range Status  09/09/2011 0.97 0.60 - 1.30 mg/dL Final   Creatinine, Ser  Date Value Ref Range Status  05/29/2022 1.12 0.76 - 1.27 mg/dL Final         Failed - eGFR is 30 or above and within 180 days    EGFR (African American)  Date Value Ref Range Status  09/09/2011 >60  Final   GFR calc Af Amer  Date Value Ref Range Status  11/21/2018 119 >59 mL/min/1.73 Final   EGFR (Non-African Amer.)  Date Value Ref Range Status  09/09/2011 >60  Final    Comment:    eGFR values <49mL/min/1.73 m2 may be an indication of chronic kidney disease (CKD). Calculated eGFR is useful in patients with stable renal function. The eGFR calculation will not be reliable in acutely ill patients when serum creatinine is changing rapidly. It is not useful in  patients on dialysis. The eGFR calculation may not be applicable to patients at the low and high extremes of body sizes, pregnant women, and vegetarians.    GFR, Estimated  Date Value Ref Range Status  02/17/2020 >60 >60 mL/min Final    Comment:    (NOTE) Calculated using the CKD-EPI Creatinine Equation (2021)    eGFR  Date Value Ref Range Status  05/29/2022 77 >59 mL/min/1.73 Final         Failed - Last BP in normal range    BP Readings from Last 1 Encounters:  08/08/22 (!) 151/98         Failed - Valid encounter within last 6 months    Recent Outpatient Visits   None            Passed - Patient is not pregnant

## 2023-07-19 NOTE — Telephone Encounter (Signed)
 Patient is requesting refills on Allopurinol , Colchicine , Lasix , Lisinopril -hydrochlorothiazide .  LOV 05/28/22 NOV 07/31/23 LRF 07/08/23 Q:90 R:0 (Allopurinol ) recently rx.  05/28/22 Q:90 R:2 (colchicine ) 11/21/22 Q:90 R:0 (lasix  and lisinopril -hydrochlorothiazide ) LABS 08/08/22 CMP, CK   Please advised if ok to refill

## 2023-07-19 NOTE — Telephone Encounter (Signed)
 Copied from CRM [id number]. Topic: Clinical - Medication Refill >> Jul 16, 2023  2:36 PM Lynnie Saucier S wrote: Medication: allopurinol  (ZYLOPRIM ) 300 MG tablet  colchicine  0.6 MG tablet  furosemide  (LASIX ) 20 MG tablet lisinopril -hydrochlorothiazide  (ZESTORETIC ) 10-12.5 MG tablet  Has the patient contacted their pharmacy? Yes (Agent: If no, request that the patient contact the pharmacy for the refill. If patient does not wish to contact the pharmacy document the reason why and proceed with request.) (Agent: If yes, when and what did the pharmacy advise?)  This is the patient's preferred pharmacy:  [id number] [place], [place] - [address] [place] [address] Phone: [phone] Fax: [phone]  Is this the correct pharmacy for this prescription? Yes If no, delete pharmacy and type the correct one.   Has the prescription been filled recently? No  Is the patient out of the medication? Yes  Has the patient been seen for an appointment in the last year OR does the patient have an upcoming appointment? Yes  Can we respond through MyChart? Yes  Agent: Please be advised that Rx refills may take up to 3 business days. We ask that you follow-up with your pharmacy. >> Jul 19, 2023  3:52 PM Lizabeth Riggs wrote: Erven made an appointment for June 11 at 8 AM. He wants to see if Dr. Shann Darnel can call the above refills in for him since he is out of all of his medications. Ethan wants to see if Dr. Shann Darnel will at least call in enough to do him until June 11. Please call Jamyson if you cannot give him enough medications until his appointment. Thanks >> Jul 18, 2023  1:02 PM Emylou G wrote: Adv wife Scherrie Curt meds still pending.. patient going back on the road

## 2023-07-22 ENCOUNTER — Other Ambulatory Visit: Payer: Self-pay | Admitting: Family Medicine

## 2023-07-22 DIAGNOSIS — I10 Essential (primary) hypertension: Secondary | ICD-10-CM

## 2023-07-22 NOTE — Telephone Encounter (Signed)
 Copied from CRM [id number]. Topic: Clinical - Medication Refill >> Jul 22, 2023  1:50 PM Thliyah D wrote: Medication: lisinopril -hydrochlorothiazide  (ZESTORETIC ) 10-12.5 MG tablet  Has the patient contacted their pharmacy? Yes (Agent: If no, request that the patient contact the pharmacy for the refill. If patient does not wish to contact the pharmacy document the reason why and proceed with request.) (Agent: If yes, when and what did the pharmacy advise?)  This is the patient's preferred pharmacy:  [id number] [place], [place] - [address] [place] [address] Phone: [phone] Fax: [phone]  [id number] - [place], [place] - [address] [place] [address] Phone: [phone] Fax: [phone]  [id number] - [address], [place] - [address] [place] [address] Phone: [phone] Fax: [phone]  [id number] [place], [place] - [address] AT [facility] [address] [address] [address] [facility] [address] Phone: [phone] Fax: [phone]  Is this the correct pharmacy for this prescription? Yes If no, delete pharmacy and type the correct one.   Has the prescription been filled recently? No  Is the patient out of the medication? Yes  Has the patient been seen for an appointment in the last year OR does the patient have an upcoming appointment? Yes  Can we respond through MyChart? Yes  Agent: Please be advised that Rx refills may take up to 3 business days. We ask that you follow-up with your pharmacy.

## 2023-07-23 NOTE — Telephone Encounter (Signed)
 Requested Prescriptions  Pending Prescriptions Disp Refills   lisinopril -hydrochlorothiazide  (ZESTORETIC ) 10-12.5 MG tablet 90 tablet 0    Sig: Take 1 tablet by mouth daily.     Cardiovascular:  ACEI + Diuretic Combos Failed - 07/23/2023  4:08 PM      Failed - Na in normal range and within 180 days    Sodium  Date Value Ref Range Status  05/29/2022 141 134 - 144 mmol/L Final  09/09/2011 142 136 - 145 mmol/L Final         Failed - K in normal range and within 180 days    Potassium  Date Value Ref Range Status  05/29/2022 4.6 3.5 - 5.2 mmol/L Final  09/09/2011 3.8 3.5 - 5.1 mmol/L Final         Failed - Cr in normal range and within 180 days    Creatinine  Date Value Ref Range Status  09/09/2011 0.97 0.60 - 1.30 mg/dL Final   Creatinine, Ser  Date Value Ref Range Status  05/29/2022 1.12 0.76 - 1.27 mg/dL Final         Failed - eGFR is 30 or above and within 180 days    EGFR (African American)  Date Value Ref Range Status  09/09/2011 >60  Final   GFR calc Af Amer  Date Value Ref Range Status  11/21/2018 119 >59 mL/min/1.73 Final   EGFR (Non-African Amer.)  Date Value Ref Range Status  09/09/2011 >60  Final    Comment:    eGFR values <83mL/min/1.73 m2 may be an indication of chronic kidney disease (CKD). Calculated eGFR is useful in patients with stable renal function. The eGFR calculation will not be reliable in acutely ill patients when serum creatinine is changing rapidly. It is not useful in  patients on dialysis. The eGFR calculation may not be applicable to patients at the low and high extremes of body sizes, pregnant women, and vegetarians.    GFR, Estimated  Date Value Ref Range Status  02/17/2020 >60 >60 mL/min Final    Comment:    (NOTE) Calculated using the CKD-EPI Creatinine Equation (2021)    eGFR  Date Value Ref Range Status  05/29/2022 77 >59 mL/min/1.73 Final         Failed - Last BP in normal range    BP Readings from Last 1 Encounters:   08/08/22 (!) 151/98         Failed - Valid encounter within last 6 months    Recent Outpatient Visits   None            Passed - Patient is not pregnant

## 2023-07-31 ENCOUNTER — Ambulatory Visit (INDEPENDENT_AMBULATORY_CARE_PROVIDER_SITE_OTHER): Payer: Self-pay | Admitting: Family Medicine

## 2023-07-31 ENCOUNTER — Encounter: Payer: Self-pay | Admitting: Family Medicine

## 2023-07-31 VITALS — BP 160/100 | HR 93 | Wt 211.0 lb

## 2023-07-31 DIAGNOSIS — I10 Essential (primary) hypertension: Secondary | ICD-10-CM

## 2023-07-31 DIAGNOSIS — E781 Pure hyperglyceridemia: Secondary | ICD-10-CM

## 2023-07-31 DIAGNOSIS — E119 Type 2 diabetes mellitus without complications: Secondary | ICD-10-CM

## 2023-07-31 DIAGNOSIS — Z1159 Encounter for screening for other viral diseases: Secondary | ICD-10-CM

## 2023-07-31 DIAGNOSIS — M1A9XX1 Chronic gout, unspecified, with tophus (tophi): Secondary | ICD-10-CM

## 2023-07-31 DIAGNOSIS — Z125 Encounter for screening for malignant neoplasm of prostate: Secondary | ICD-10-CM

## 2023-07-31 NOTE — Patient Instructions (Signed)
 Marland Kitchen  Please review the attached list of medications and notify my office if there are any errors.   . Please bring all of your medications to every appointment so we can make sure that our medication list is the same as yours.

## 2023-08-01 LAB — LIPID PANEL
Chol/HDL Ratio: 9 ratio — ABNORMAL HIGH (ref 0.0–5.0)
Cholesterol, Total: 215 mg/dL — ABNORMAL HIGH (ref 100–199)
HDL: 24 mg/dL — ABNORMAL LOW (ref >39)
LDL Chol Calc (NIH): 85 mg/dL (ref 0–99)
Triglycerides: 650 mg/dL (ref 0–149)
VLDL Cholesterol Cal: 106 mg/dL — ABNORMAL HIGH (ref 5–40)

## 2023-08-01 LAB — COMPREHENSIVE METABOLIC PANEL WITH GFR
ALT: 26 IU/L (ref 0–44)
AST: 24 IU/L (ref 0–40)
Albumin: 4.2 g/dL (ref 3.8–4.9)
Alkaline Phosphatase: 124 IU/L — ABNORMAL HIGH (ref 44–121)
BUN/Creatinine Ratio: 17 (ref 9–20)
BUN: 18 mg/dL (ref 6–24)
Bilirubin Total: 0.3 mg/dL (ref 0.0–1.2)
CO2: 20 mmol/L (ref 20–29)
Calcium: 9.5 mg/dL (ref 8.7–10.2)
Chloride: 98 mmol/L (ref 96–106)
Creatinine, Ser: 1.07 mg/dL (ref 0.76–1.27)
Globulin, Total: 2.7 g/dL (ref 1.5–4.5)
Glucose: 277 mg/dL — ABNORMAL HIGH (ref 70–99)
Potassium: 4.1 mmol/L (ref 3.5–5.2)
Sodium: 139 mmol/L (ref 134–144)
Total Protein: 6.9 g/dL (ref 6.0–8.5)
eGFR: 81 mL/min/{1.73_m2} (ref >59)

## 2023-08-01 LAB — MICROALBUMIN / CREATININE URINE RATIO
Creatinine, Urine: 82.1 mg/dL
Microalb/Creat Ratio: 401 mg/g{creat} — ABNORMAL HIGH (ref 0–29)
Microalbumin, Urine: 329.4 ug/mL

## 2023-08-01 LAB — CBC
Hematocrit: 47 % (ref 37.5–51.0)
Hemoglobin: 15.3 g/dL (ref 13.0–17.7)
MCH: 29.5 pg (ref 26.6–33.0)
MCHC: 32.6 g/dL (ref 31.5–35.7)
MCV: 91 fL (ref 79–97)
Platelets: 236 10*3/uL (ref 150–450)
RBC: 5.19 x10E6/uL (ref 4.14–5.80)
RDW: 13.8 % (ref 11.6–15.4)
WBC: 10.4 10*3/uL (ref 3.4–10.8)

## 2023-08-01 LAB — PSA TOTAL (REFLEX TO FREE): Prostate Specific Ag, Serum: 0.4 ng/mL (ref 0.0–4.0)

## 2023-08-01 LAB — TSH: TSH: 1.67 u[IU]/mL (ref 0.450–4.500)

## 2023-08-01 LAB — HEMOGLOBIN A1C
Est. average glucose Bld gHb Est-mCnc: 177 mg/dL
Hgb A1c MFr Bld: 7.8 % — ABNORMAL HIGH (ref 4.8–5.6)

## 2023-08-01 LAB — HEPATITIS C ANTIBODY: Hep C Virus Ab: NONREACTIVE

## 2023-08-02 ENCOUNTER — Ambulatory Visit: Payer: Self-pay | Admitting: Family Medicine

## 2023-08-02 DIAGNOSIS — I10 Essential (primary) hypertension: Secondary | ICD-10-CM

## 2023-08-02 DIAGNOSIS — E781 Pure hyperglyceridemia: Secondary | ICD-10-CM

## 2023-08-02 DIAGNOSIS — E119 Type 2 diabetes mellitus without complications: Secondary | ICD-10-CM

## 2023-08-06 ENCOUNTER — Telehealth: Payer: Self-pay

## 2023-08-06 NOTE — Telephone Encounter (Signed)
 Copied from CRM [id number]. Topic: Clinical - Medication Question >> Aug 06, 2023  4:08 PM Fonda T wrote: Reason for CRM: Patient spouse Aldo Amble verified) calling to check status of earlier message regarding Blood pressure medication for patient.  Called and spoke to front office, Peterson Brandt, states provider of patient is out of office. Requested another message be sent as high priority to be addressed by another nurse/provider.  CRM sent as directed to clinical pool for office.  Informed patient spouse, verbalized understanding.  Please call and follow up with patient at [phone].

## 2023-08-07 ENCOUNTER — Telehealth: Payer: Self-pay

## 2023-08-07 MED ORDER — VALSARTAN-HYDROCHLOROTHIAZIDE 160-12.5 MG PO TABS
1.0000 | ORAL_TABLET | Freq: Every day | ORAL | 3 refills | Status: AC
Start: 2023-08-07 — End: ?

## 2023-08-07 MED ORDER — PIOGLITAZONE HCL 30 MG PO TABS: 30.0000 mg | ORAL_TABLET | Freq: Every day | ORAL | 2 refills | Status: AC

## 2023-08-07 MED ORDER — ROSUVASTATIN CALCIUM 10 MG PO TABS
10.0000 mg | ORAL_TABLET | Freq: Every day | ORAL | 3 refills | Status: AC
Start: 2023-08-07 — End: ?

## 2023-08-07 NOTE — Telephone Encounter (Signed)
 Copied from CRM [id number]. Topic: Clinical - Lab/Test Results >> Aug 06, 2023  8:07 AM Dimple Francis wrote: Reason for CRM: Patient talked about going on a different blood pressure medication after he did lab results, wanting to know if that is going to be changed, please follow up with patient

## 2023-08-07 NOTE — Telephone Encounter (Signed)
 See result note.

## 2023-08-08 NOTE — Telephone Encounter (Signed)
 Please see result note

## 2023-08-12 ENCOUNTER — Ambulatory Visit: Payer: Self-pay | Admitting: Family Medicine

## 2023-08-13 MED ORDER — ALLOPURINOL 300 MG PO TABS
300.0000 mg | ORAL_TABLET | Freq: Every day | ORAL | 3 refills | Status: AC
Start: 2023-08-13 — End: ?

## 2023-08-13 NOTE — Progress Notes (Signed)
 Established patient visit   Patient: Howard Sanders   DOB: 01/14/1966   58 y.o. Male  MRN: [id number] Visit Date: 07/31/2023  Today's healthcare provider: Nancyann Perry, MD   Chief Complaint  Patient presents with   Medical Management of Chronic Issues   Subjective    HPI  Presents for follow up hypertension, hyperlipidemia, diet controlled diabetes, and gout. Doing well with current medications.   Reports he is taking medications consistently every day. His blood pressure tends to run high during medical visits, but at home, it averages around 138/70 mmHg. No issues with swelling, and he states that his swelling is 'good'. 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 He has incorporated a regimen of natural supplements into his daily routine, including turmeric, ginger, garlic, black pepper, beetroot, carrot, greens, and cinnamon, which he believes helps with joint pain and swelling.  Medications: Outpatient Medications Prior to Visit  Medication Sig   allopurinol  (ZYLOPRIM ) 300 MG tablet Take 1 tablet (300 mg total) by mouth daily.   colchicine  0.6 MG tablet Take 1 tablet (0.6 mg total) by mouth daily as needed (gout).   furosemide  (LASIX ) 20 MG tablet Take 1 tablet (20 mg total) by mouth daily. For swelling   GARLIC PO Take by mouth.   Ginger, Zingiber officinalis, (GINGER PO) Take by mouth.   TURMERIC PO Take by mouth.   [DISCONTINUED] lisinopril -hydrochlorothiazide  (ZESTORETIC ) 10-12.5 MG tablet Take 1 tablet by mouth daily.   lovastatin  (MEVACOR ) 40 MG tablet Take 1 tablet (40 mg total) by mouth daily.   No facility-administered medications prior to visit.    Review of Systems  Constitutional:  Negative for appetite change, chills and fever.  Respiratory:  Negative for chest tightness, shortness of breath and wheezing.   Cardiovascular:  Negative for chest pain and palpitations.  Gastrointestinal:  Negative for abdominal pain, nausea and vomiting.        Objective    BP (!) 160/100 (BP Location: Left Arm, Patient Position: Sitting, Cuff Size: Normal) Comment: manual/Normal cuff  Pulse 93   Wt 211 lb (95.7 kg)   SpO2 98%   BMI 28.62 kg/m    Physical Exam   General: Appearance:    Well developed, well nourished male in no acute distress  Eyes:    PERRL, conjunctiva/corneas clear, EOM's intact       Lungs:     Clear to auscultation bilaterally, respirations unlabored  Heart:    Normal heart rate. Normal rhythm. No murmurs, rubs, or gallops.    MS:   All extremities are intact.    Neurologic:   Awake, alert, oriented x 3. No apparent focal neurological defect.         Assessment & Plan     1. Primary hypertension (Primary) Fairly well controlled. Continue current medications.    2. Type 2 diabetes mellitus without complication, without long-term current use of insulin (HCC) Diet controlled.  - Hemoglobin A1c - Urine Albumin/Creatinine with ratio (send out) [LAB689]  3. Hypertriglyceridemia He is tolerating lovastatin  well with no adverse effects.   - CBC - Comprehensive metabolic panel with GFR - Lipid panel - TSH  4. Chronic tophaceous gout No flares on current allopurinol  dose.   5. Need for hepatitis C screening test  - Hepatitis C Antibody  6. Prostate cancer screening  - PSA Total (Reflex To Free)         Nancyann Perry, MD  [facility]  Family Practice [phone] (phone) [phone] (fax)  [facility]

## 2023-09-30 ENCOUNTER — Ambulatory Visit: Payer: Self-pay | Admitting: Family Medicine

## 2023-10-20 ENCOUNTER — Other Ambulatory Visit: Payer: Self-pay | Admitting: Family Medicine

## 2023-10-20 DIAGNOSIS — I10 Essential (primary) hypertension: Secondary | ICD-10-CM
